# Patient Record
Sex: Male | Born: 1985 | Race: Black or African American | Hispanic: No | Marital: Single | Smoking: Never smoker
Health system: Southern US, Community
[De-identification: ages and names within clinical notes are randomized; demographics above are authoritative.]

## PROBLEM LIST (undated history)

## (undated) DIAGNOSIS — I10 Essential (primary) hypertension: Secondary | ICD-10-CM

---
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
## 2004-09-18 ENCOUNTER — Emergency Department (HOSPITAL_COMMUNITY): Admission: EM | Admit: 2004-09-18 | Discharge: 2004-09-18 | Payer: Self-pay | Admitting: Emergency Medicine

## 2006-03-05 ENCOUNTER — Emergency Department (HOSPITAL_COMMUNITY): Admission: EM | Admit: 2006-03-05 | Discharge: 2006-03-06 | Payer: Self-pay | Admitting: Emergency Medicine

## 2006-03-24 ENCOUNTER — Emergency Department (HOSPITAL_COMMUNITY): Admission: EM | Admit: 2006-03-24 | Discharge: 2006-03-24 | Payer: Self-pay | Admitting: Pediatrics

## 2006-04-17 ENCOUNTER — Emergency Department (HOSPITAL_COMMUNITY): Admission: EM | Admit: 2006-04-17 | Discharge: 2006-04-17 | Payer: Self-pay | Admitting: Emergency Medicine

## 2006-08-06 ENCOUNTER — Emergency Department (HOSPITAL_COMMUNITY): Admission: EM | Admit: 2006-08-06 | Discharge: 2006-08-06 | Payer: Self-pay | Admitting: Family Medicine

## 2007-05-06 ENCOUNTER — Emergency Department (HOSPITAL_COMMUNITY): Admission: EM | Admit: 2007-05-06 | Discharge: 2007-05-06 | Payer: Self-pay | Admitting: Emergency Medicine

## 2008-06-15 ENCOUNTER — Emergency Department (HOSPITAL_COMMUNITY): Admission: EM | Admit: 2008-06-15 | Discharge: 2008-06-15 | Payer: Self-pay | Admitting: Emergency Medicine

## 2009-06-15 ENCOUNTER — Emergency Department (HOSPITAL_COMMUNITY): Admission: EM | Admit: 2009-06-15 | Discharge: 2009-06-15 | Payer: Self-pay | Admitting: Emergency Medicine

## 2011-12-19 ENCOUNTER — Encounter (HOSPITAL_COMMUNITY): Payer: Self-pay | Admitting: Emergency Medicine

## 2011-12-19 ENCOUNTER — Emergency Department (HOSPITAL_COMMUNITY): Payer: Self-pay

## 2011-12-19 ENCOUNTER — Emergency Department (HOSPITAL_COMMUNITY)
Admission: EM | Admit: 2011-12-19 | Discharge: 2011-12-19 | Disposition: A | Discharge status: Home / Self Care | Payer: Self-pay | Attending: Emergency Medicine | Admitting: Emergency Medicine

## 2011-12-19 DIAGNOSIS — I10 Essential (primary) hypertension: Secondary | ICD-10-CM | POA: Insufficient documentation

## 2011-12-19 DIAGNOSIS — R51 Headache: Secondary | ICD-10-CM | POA: Insufficient documentation

## 2011-12-19 MED ORDER — IBUPROFEN 600 MG PO TABS: 600.0000 mg | ORAL_TABLET | Freq: Four times a day (QID) | ORAL | Status: AC | PRN

## 2011-12-19 MED ORDER — CEPHALEXIN 500 MG PO CAPS: 500.0000 mg | ORAL_CAPSULE | Freq: Four times a day (QID) | ORAL | Status: AC

## 2011-12-19 NOTE — ED Notes (Signed)
Pt states he has a lump under the skin on the right side of his face next to his nose  Pt states it is not painful but feels hot every now and then

## 2011-12-19 NOTE — Discharge Instructions (Signed)
Ibuprofen for pain. Take keflex as prescribed until all gone for possible infection. Follow up with a primary care doctor for further evaluation. You can also follow up with ENT.   Facial Infection You have an infection of your face. This requires special attention to help prevent serious problems. Infections in facial wounds can cause poor healing and scars. They can also spread to deeper tissues, especially around the eye. Wound and dental infections can lead to sinusitis, infection of the eye socket, and even meningitis. Permanent damage to the skin, eye, and nervous system may result if facial infections are not treated properly. With severe infections, hospital care for IV antibiotic injections may be needed if they don't respond to oral antibiotics. Antibiotics must be taken for the full course to insure the infection is eliminated. If the infection came from a bad tooth, it may have to be extracted when the infection is under control. Warm compresses may be applied to reduce skin irritation and remove drainage. You might need a tetanus shot now if:  You cannot remember when your last tetanus shot was.   You have never had a tetanus shot.   The object that caused your wound was dirty.  If you need a tetanus shot, and you decide not to get one, there is a rare chance of getting tetanus. Sickness from tetanus can be serious. If you got a tetanus shot, your arm may swell, get red and warm to the touch at the shot site. This is common and not a problem. SEEK IMMEDIATE MEDICAL CARE IF:   You have increased swelling, redness, or trouble breathing.   You have a severe headache, dizziness, nausea, or vomiting.   You develop problems with your eyesight.   You have a fever.  Document Released: 07/19/2004 Document Revised: 05/31/2011 Document Reviewed: 06/11/2005 [facility] Patient Information 2012 [place], [place].

## 2011-12-19 NOTE — ED Provider Notes (Signed)
History     CSN: [id number]  Arrival date & time 12/19/11  0620   First MD Initiated Contact with Patient 12/19/11 [phone]      Chief Complaint  Patient presents with  . Facial Swelling    (Consider location/radiation/quality/duration/timing/severity/associated sxs/prior treatment) HPI Comments: Pt is a 26yo male who presents with cc of facial pain and swelling. States "I have a blood clot in my face."  States pain started few days ago, it is in his right maxilla. Are tender to palpation. No injury. No visual changes, no ear ache, no toothache, no sore throat. Denies fever, chills. States hx of the same few years ago, states was told he had a blood clot and was put on some medication.       History reviewed. No pertinent past medical history.  History reviewed. No pertinent past surgical history.  Family History  Problem Relation Age of Onset  . Hypertension Other     History  Substance Use Topics  . Smoking status: Never Smoker   . Smokeless tobacco: Not on file  . Alcohol Use: No      Review of Systems  Constitutional: Negative for fever and chills.  HENT: Positive for facial swelling. Negative for ear pain, congestion, sore throat, mouth sores, neck pain, neck stiffness and dental problem.   Respiratory: Negative.   Cardiovascular: Negative.   Musculoskeletal: Negative.   Skin: Positive for color change.    Allergies  Review of patient's allergies indicates no known allergies.  Home Medications  No current outpatient prescriptions on file.  BP 132/86  Pulse 58  Temp 98.3 F (36.8 C) (Oral)  Resp 18  SpO2 100%  Physical Exam  Nursing note and vitals reviewed. Constitutional: He is oriented to person, place, and time. He appears well-developed and well-nourished. No distress.  HENT:  Head: Normocephalic.  Right Ear: External ear normal.  Left Ear: External ear normal.  Nose: Nose normal.  Mouth/Throat: Oropharynx is clear and moist.       Tender to  palpation over right maxilla. I do not see any erythema, no induration, no swelling. Dentition is normal. Ear canal and TMs normal  Eyes: Conjunctivae are normal.  Neck: Neck supple.  Cardiovascular: Normal rate, regular rhythm and normal heart sounds.   Pulmonary/Chest: Effort normal and breath sounds normal.  Neurological: He is alert and oriented to person, place, and time.  Skin: Skin is warm and dry.  Psychiatric: He has a normal mood and affect.    ED Course  Procedures (including critical care time)  No results found for this or any previous visit. Dg Orthopantogram  12/19/2011  *RADIOLOGY REPORT*  Clinical Data: Facial pain.  Evaluate for abscess. Swelling of the right maxilla.  ORTHOPANTOGRAM/PANORAMIC  Comparison: No priors.  Findings: Teeth number 19 and 31 are absent.  No definite periapical lucencies are identified to suggest abscesses.  The mandible is intact.  Visualized portions of the paranasal sinus are unremarkable.  IMPRESSION: 1.  No definite periapical lucencies to suggest periapical abscesses.  Original Report Authenticated By: Florencia Reasons, M.D.    Pt with right facial pain for few days. States this has been happening for several years on and off. I do not see any swelling or palpate any masses/cyst/abscess over this area. Pt was seen x3 for the same few years ago, and was treated with antibiotics. Pt wants referral to a specialist. He is non toxic, afebrile. Will start on antibiotic, follow up with PCP and /or  ent.   1. Facial pain       MDM          Lottie Mussel, PA 12/19/11 [phone]

## 2011-12-19 NOTE — ED Provider Notes (Signed)
Medical screening examination/treatment/procedure(s) were performed by non-physician practitioner and as supervising physician I was immediately available for consultation/collaboration.  Olivia Mackie, MD 12/19/11 [phone]

## 2012-12-13 ENCOUNTER — Encounter (HOSPITAL_COMMUNITY): Payer: Self-pay | Admitting: *Deleted

## 2012-12-13 ENCOUNTER — Emergency Department (HOSPITAL_COMMUNITY)
Admission: EM | Admit: 2012-12-13 | Discharge: 2012-12-13 | Disposition: A | Discharge status: Home / Self Care | Payer: Worker's Compensation | Attending: Emergency Medicine | Admitting: Emergency Medicine

## 2012-12-13 DIAGNOSIS — S61409A Unspecified open wound of unspecified hand, initial encounter: Secondary | ICD-10-CM | POA: Insufficient documentation

## 2012-12-13 DIAGNOSIS — Y9289 Other specified places as the place of occurrence of the external cause: Secondary | ICD-10-CM | POA: Insufficient documentation

## 2012-12-13 DIAGNOSIS — Z23 Encounter for immunization: Secondary | ICD-10-CM | POA: Insufficient documentation

## 2012-12-13 DIAGNOSIS — W460XXA Contact with hypodermic needle, initial encounter: Secondary | ICD-10-CM | POA: Insufficient documentation

## 2012-12-13 DIAGNOSIS — Y939 Activity, unspecified: Secondary | ICD-10-CM | POA: Insufficient documentation

## 2012-12-13 MED ORDER — HEPATITIS B IMMUNE GLOBULIN IM SOLN
0.0600 mL/kg | Freq: Once | INTRAMUSCULAR | Status: DC
  Filled 2012-12-13: qty 10

## 2012-12-13 MED ORDER — HEPATITIS B VAC RECOMBINANT 5 MCG/0.5ML IJ SUSP
0.5000 mL | Freq: Once | INTRAMUSCULAR | Status: AC
  Administered 2012-12-13: 5 ug via INTRAMUSCULAR
  Filled 2012-12-13: qty 0.5

## 2012-12-13 MED ORDER — TETANUS-DIPHTH-ACELL PERTUSSIS 5-2.5-18.5 LF-MCG/0.5 IM SUSP
0.5000 mL | Freq: Once | INTRAMUSCULAR | Status: AC
  Administered 2012-12-13: 0.5 mL via INTRAMUSCULAR
  Filled 2012-12-13: qty 0.5

## 2012-12-13 MED ORDER — HEPATITIS B IMMUNE GLOBULIN IM SOLN
5.0000 mL | Freq: Once | INTRAMUSCULAR | Status: AC
  Administered 2012-12-13: 5 mL via INTRAMUSCULAR
  Filled 2012-12-13: qty 5

## 2012-12-13 NOTE — ED Provider Notes (Signed)
History     CSN: [id number]  Arrival date & time 12/13/12  0206   First MD Initiated Contact with Patient 12/13/12 0242      Chief Complaint  Patient presents with  . Puncture Wound    (Consider location/radiation/quality/duration/timing/severity/associated sxs/prior treatment) HPI 27 year old male presents to emergency room after a needlestick.  Patient works a Biomedical scientist.  He was stuck with a insulin syringe in his right palm.  Patient reports it bled heavily after he was stuck.  He claims that thoroughly with soap and water, and alcohol.  No further bleeding.  He does not know where the needles came from, with no way of tracing the source.  Patient denies any risk factors for HIV.  He has never had hepatitis B vaccinations.  He does not know his last tetanus shot.  History reviewed. No pertinent past medical history.  History reviewed. No pertinent past surgical history.  Family History  Problem Relation Age of Onset  . Hypertension Other     History  Substance Use Topics  . Smoking status: Never Smoker   . Smokeless tobacco: Not on file  . Alcohol Use: No      Review of Systems  All other systems reviewed and are negative.    Allergies  Review of patient's allergies indicates no known allergies.  Home Medications  No current outpatient prescriptions on file.  BP 132/91  Pulse 63  Temp(Src) 97.8 F (36.6 C) (Oral)  Resp 18  Ht 5\' 9"  (1.753 m)  Wt 195 lb (88.451 kg)  BMI 28.78 kg/m2  SpO2 100%  Physical Exam  Nursing note and vitals reviewed. Constitutional: He appears well-developed and well-nourished. No distress.  Musculoskeletal: Normal range of motion. He exhibits no edema and no tenderness.  Unable to find the punctured area on his palm.  No swelling, no drainage, no erythema  Skin: He is not diaphoretic.    ED Course  Procedures (including critical care time)  Labs Reviewed  RAPID HIV SCREEN ([facility])  HEPATITIS PANEL, ACUTE    No results found.   1. Needlestick injury accident, initial encounter       MDM  27 yo male with needlestick.  D/w Dr Ninetta Lights on call for ID.  He does not recommend PEP therapy for HIV.  Will give Hep B IG and 1st vaccine, get baseline HIV and hepatitis panel.        Olivia Mackie, MD 12/13/12 [phone]

## 2012-12-13 NOTE — ED Notes (Signed)
The patient is AOx4 and comfortable with the discharge instructions. 

## 2012-12-13 NOTE — ED Notes (Signed)
The pt works at the [employer] and he was getting bags off the belt when a bag of insulin needles came off and one stuck the palm of his rt hand

## 2012-12-14 LAB — HEPATITIS PANEL, ACUTE
Hep A IgM: NEGATIVE
Hep B C IgM: NEGATIVE

## 2013-07-25 ENCOUNTER — Emergency Department (HOSPITAL_COMMUNITY)
Admission: EM | Admit: 2013-07-25 | Discharge: 2013-07-25 | Disposition: A | Discharge status: Home / Self Care | Payer: Self-pay | Attending: Emergency Medicine | Admitting: Emergency Medicine

## 2013-07-25 ENCOUNTER — Encounter (HOSPITAL_COMMUNITY): Payer: Self-pay | Admitting: Emergency Medicine

## 2013-07-25 DIAGNOSIS — L02519 Cutaneous abscess of unspecified hand: Secondary | ICD-10-CM | POA: Insufficient documentation

## 2013-07-25 DIAGNOSIS — Y9389 Activity, other specified: Secondary | ICD-10-CM | POA: Insufficient documentation

## 2013-07-25 DIAGNOSIS — L03019 Cellulitis of unspecified finger: Principal | ICD-10-CM | POA: Insufficient documentation

## 2013-07-25 DIAGNOSIS — W268XXA Contact with other sharp object(s), not elsewhere classified, initial encounter: Secondary | ICD-10-CM | POA: Insufficient documentation

## 2013-07-25 DIAGNOSIS — Y9289 Other specified places as the place of occurrence of the external cause: Secondary | ICD-10-CM | POA: Insufficient documentation

## 2013-07-25 DIAGNOSIS — L02511 Cutaneous abscess of right hand: Secondary | ICD-10-CM

## 2013-07-25 MED ORDER — CEPHALEXIN 500 MG PO CAPS: 500.0000 mg | ORAL_CAPSULE | Freq: Two times a day (BID) | ORAL | Status: DC

## 2013-07-25 NOTE — ED Provider Notes (Signed)
CSN: [id number]     Arrival date & time 07/25/13  1326 History  This chart was scribed for Coral Ceo, PA working with Suzi Roots, MD, by Lindajo Royal ED Scribe. This patient was seen in room TR05C/TR05C and the patient's care was started at 2:35 PM.    Chief Complaint  Patient presents with  . Hand Pain    The history is provided by the patient. No language interpreter was used.   HPI Comments: Austin Alvarez is a 28 y.o. male with no PMH who presents to the Emergency Department complaining of a blister on his right thumb that began 2 days ago.  Patient states that he cut himself at work opening boxes. He denies any foreign bodies/trauma/burns. He states he developed a blister around the area which grew larger.  Pt reports draining the blister prior to reporting to the ED.  He reports clear discharge with no purulent drainage. Pt describes the pain as burning. Pain is worse with movement.  Patient did not take anything for pain PTA.  Pt denies associated symptoms including fever, weakness, loss of sensation, numbness/tingling. Pt denies any past medical conditions. Pt reports tetanus shots are UTD.   History reviewed. No pertinent past medical history. History reviewed. No pertinent past surgical history. Family History  Problem Relation Age of Onset  . Hypertension Other    History  Substance Use Topics  . Smoking status: Never Smoker   . Smokeless tobacco: Not on file  . Alcohol Use: No    Review of Systems  Constitutional: Negative for fever.  Skin: Positive for wound (Blister on right mp joint).  All other systems reviewed and are negative.   Allergies  Review of patient's allergies indicates no known allergies.  Home Medications   Current Outpatient Rx  Name  Route  Sig  Dispense  Refill  . cephALEXin (KEFLEX) 500 MG capsule   Oral   Take 1 capsule (500 mg total) by mouth 2 (two) times daily.   20 capsule   0     Triage Vitals: BP 138/93  Pulse 79   Temp(Src) 98.2 F (36.8 C) (Oral)  Resp 16  Ht 5\' 8"  (1.727 m)  Wt 171 lb 3.2 oz (77.656 kg)  BMI 26.04 kg/m2  SpO2 97%  Filed Vitals:   07/25/13 1331  BP: 138/93  Pulse: 79  Temp: 98.2 F (36.8 C)  TempSrc: Oral  Resp: 16  Height: 5\' 8"  (1.727 m)  Weight: 171 lb 3.2 oz (77.656 kg)  SpO2: 97%    Physical Exam  Nursing note and vitals reviewed. Constitutional: He is oriented to person, place, and time. He appears well-developed and well-nourished. No distress.  HENT:  Head: Normocephalic and atraumatic.  Right Ear: External ear normal.  Left Ear: External ear normal.  Mouth/Throat: Oropharynx is clear and moist.  Eyes: Conjunctivae are normal. Right eye exhibits no discharge. Left eye exhibits no discharge.  Neck: Normal range of motion. Neck supple.  Cardiovascular: Normal rate, regular rhythm and normal heart sounds.  Exam reveals no gallop and no friction rub.   No murmur heard. Radial pulses present and equal bilaterally. Capillary refill <2 seconds on digits of right hand.   Pulmonary/Chest: Effort normal and breath sounds normal. No respiratory distress. He has no wheezes. He has no rales. He exhibits no tenderness.  Abdominal: Soft. He exhibits no distension. There is no tenderness.  Musculoskeletal: Normal range of motion. He exhibits edema and tenderness.  Hands: Patient able to flex and extend digits of right hand without difficulty or limitations.    Neurological: He is alert and oriented to person, place, and time.  Sensation intact in the right hand  Skin: Skin is warm and dry. He is not diaphoretic.  1.5 cm x 1.5 cm fluctuant mass to the right thenar eminence and MP joint of the right hand.  No surrounding edema or erythema.  No open wounds or lacerations.     ED Course  Procedures (including critical care time)  DIAGNOSTIC STUDIES: Oxygen Saturation is 97% on RA, normal by my interpretation.    COORDINATION OF CARE: 2:39 PM- Will drain the wound.  Pt advised of plan for treatment and pt agrees.  Labs Review Labs Reviewed - No data to display Imaging Review No results found.  EKG Interpretation   None      INCISION AND DRAINAGE Date/Time: 07/25/2013 2:30 PM Performed by: Coral CeoPALMER, Janat Tabbert K Authorized by: Coral CeoPALMER, Clearance Chenault K Consent: Verbal consent obtained. Consent given by: patient Patient identity confirmed: verbally with patient Type: abscess Body area: upper extremity Location details: right thumb Anesthesia: local infiltration Local anesthetic: lidocaine 2% without epinephrine Anesthetic total: 1 ml Patient sedated: no Scalpel size: 11 Incision type: single straight Complexity: simple Drainage: serous and purulent Drainage amount: moderate Wound treatment: wound left open Packing material: none Patient tolerance: Patient tolerated the procedure well with no immediate complications.   MDM   Austin Alvarez is a 28 y.o. male with no PMH who presents to the Emergency Department complaining of a blister on his right thumb that began 2 days ago.  Etiology of pain blister vs developing abscess.  I&D revealed serous and purulent material.  Patient neurovascularly intact. Patient placed on antibiotics (cannot afford Augmentin). Hand surgery consulted. Tetanus up to date. Return precautions, discharge instructions, and follow-up was discussed with the patient before discharge.    Consults  3:15 PM = Spoke with Dr. Izora Ribasoley who will follow-up in clinic.   Discharge Medication List as of 07/25/2013  3:17 PM    START taking these medications   Details  cephALEXin (KEFLEX) 500 MG capsule Take 1 capsule (500 mg total) by mouth 2 (two) times daily., Starting 07/25/2013, Until Discontinued, Print        Final impressions: 1. Abscess of thumb, right      Thomasenia SalesJessica Katlin Mishon Blubaugh PA-C    I personally performed the services described in this documentation, which was scribed in my presence. The recorded information has been  reviewed and is accurate.        Jillyn LedgerJessica K Mikayela Deats, PA-C 07/25/13 2154

## 2013-07-25 NOTE — ED Notes (Addendum)
Pt states he may have cut his R thumb at work Thursday on a box.  Pt states there was a blister, he popped it, clear fluid came out, and blister was smaller.  Pt woke today with significant swelling to R hand and large blister to R thumb.  Pt states his last Tetanus vaccine was about a year ago.

## 2013-07-25 NOTE — ED Notes (Addendum)
Pt was opening boxes on Thursday and thought he cut his R palm below thumb. Then yesterday woke with painful blister to the area. States "it feels like its on fire" but denies burning the area

## 2013-07-25 NOTE — Discharge Instructions (Signed)
Keep area clean and dry Apply antibiotic ointment  Do warm soaks a few times a day  Take antibiotics  Return to the emergency department if you develop any changing/worsening condition, spreading redness/swelling, increased drainage of pus, fever, severe pain or any other concerns (please read additional information regarding your condition below)   Abscess An abscess is an infected area that contains a collection of pus and debris.It can occur in almost any part of the body. An abscess is also known as a furuncle or boil. CAUSES  An abscess occurs when tissue gets infected. This can occur from blockage of oil or sweat glands, infection of hair follicles, or a minor injury to the skin. As the body tries to fight the infection, pus collects in the area and creates pressure under the skin. This pressure causes pain. People with weakened immune systems have difficulty fighting infections and get certain abscesses more often.  SYMPTOMS Usually an abscess develops on the skin and becomes a painful mass that is red, warm, and tender. If the abscess forms under the skin, you may feel a moveable soft area under the skin. Some abscesses break open (rupture) on their own, but most will continue to get worse without care. The infection can spread deeper into the body and eventually into the bloodstream, causing you to feel ill.  DIAGNOSIS  Your caregiver will take your medical history and perform a physical exam. A sample of fluid may also be taken from the abscess to determine what is causing your infection. TREATMENT  Your caregiver may prescribe antibiotic medicines to fight the infection. However, taking antibiotics alone usually does not cure an abscess. Your caregiver may need to make a small cut (incision) in the abscess to drain the pus. In some cases, gauze is packed into the abscess to reduce pain and to continue draining the area. HOME CARE INSTRUCTIONS   Only take over-the-counter or  prescription medicines for pain, discomfort, or fever as directed by your caregiver.  If you were prescribed antibiotics, take them as directed. Finish them even if you start to feel better.  If gauze is used, follow your caregiver's directions for changing the gauze.  To avoid spreading the infection:  Keep your draining abscess covered with a bandage.  Wash your hands well.  Do not share personal care items, towels, or whirlpools with others.  Avoid skin contact with others.  Keep your skin and clothes clean around the abscess.  Keep all follow-up appointments as directed by your caregiver. SEEK MEDICAL CARE IF:   You have increased pain, swelling, redness, fluid drainage, or bleeding.  You have muscle aches, chills, or a general ill feeling.  You have a fever. MAKE SURE YOU:   Understand these instructions.  Will watch your condition.  Will get help right away if you are not doing well or get worse. Document Released: 03/21/2005 Document Revised: 12/11/2011 Document Reviewed: 08/24/2011 [facility] Patient Information 2014 [place], [place].  Blisters Blisters are fluid-filled sacs that form within the skin. Common causes of blistering are friction, burns, and exposure to irritating chemicals. The fluid in the blister protects the underlying damaged skin. Most of the time it is not recommended that you open blisters. When a blister is opened, there is an increased chance for infection. Usually, a blister will open on its own. They then dry up and peel off within 10 days. If the blister is tense and uncomfortable (painful) the fluid may be drained. If it is drained the roof  of the blister should be left intact. The draining should only be done by a medical professional under aseptic conditions. Poorly fitting shoes and boots can cause blisters by being too tight or too loose. Wearing extra socks or using tape, bandages, or pads over the blister-prone area helps prevent the problem  by reducing friction. Blisters heal more slowly if you have diabetes or if you have problems with your circulation. You need to be careful about medical follow-up to prevent infection. HOME CARE INSTRUCTIONS  Protect areas where blisters have formed until the skin is healed. Use a special bandage with a hole cut in the middle around the blister. This reduces pressure and friction. When the blister breaks, trim off the loose skin and keep the area clean by washing it with soap daily. Soaking the blister or broken-open blister with diluted vinegar twice daily for 15 minutes will dry it up and speed the healing. Use 3 tablespoons of white vinegar per quart of water (45 mL white vinegar per liter of water). An antibiotic ointment and a bandage can be used to cover the area after soaking.  SEEK MEDICAL CARE IF:   You develop increased redness, pain, swelling, or drainage in the blistered area.  You develop a pus-like discharge from the blistered area, chills, or a fever. MAKE SURE YOU:   Understand these instructions.  Will watch your condition.  Will get help right away if you are not doing well or get worse. Document Released: 07/19/2004 Document Revised: 09/03/2011 Document Reviewed: 06/16/2008 [facility] Patient Information 2014 [place], [place].   Emergency Department Resource Guide 1) Find a Doctor and Pay Out of Pocket Although you won't have to find out who is covered by your insurance plan, it is a good idea to ask around and get recommendations. You will then need to call the office and see if the doctor you have chosen will accept you as a new patient and what types of options they offer for patients who are self-pay. Some doctors offer discounts or will set up payment plans for their patients who do not have insurance, but you will need to ask so you aren't surprised when you get to your appointment.  2) Contact Your Local Health Department Not all health departments have doctors that can  see patients for sick visits, but many do, so it is worth a call to see if yours does. If you don't know where your local health department is, you can check in your phone book. The CDC also has a tool to help you locate your state's health department, and many state websites also have listings of all of their local health departments.  3) Find a Walk-in Clinic If your illness is not likely to be very severe or complicated, you may want to try a walk in clinic. These are popping up all over the country in pharmacies, drugstores, and shopping centers. They're usually staffed by nurse practitioners or physician assistants that have been trained to treat common illnesses and complaints. They're usually fairly quick and inexpensive. However, if you have serious medical issues or chronic medical problems, these are probably not your best option.  No Primary Care Doctor: - Call Health Connect at  [phone] - they can help you locate a primary care doctor that  accepts your insurance, provides certain services, etc. - Physician Referral Service- [phone]  Chronic Pain Problems: Organization         Address  Phone   Notes  [facility]  [phone] Patients need to be referred by their primary care doctor.   Medication Assistance: Organization         Address  Phone   Notes  [facility] [address] [place], [place] [address] [phone]) [phone] --Must be a resident of [facility] [place] -- Must have NO insurance coverage whatsoever (no Medicaid/ Medicare, etc.) -- The pt. MUST have a primary care doctor that directs their care regularly and follows them in the community   Med[facility]ist  [phone]   [employer]  [phone]    Agencies that provide inexpensive medical care: Organization         Address  Phone   Notes  [facility]  [phone]   [facility]    [phone]   [facility] [address] [place], [place] [address] [phone]) [phone]   [facility] [place] [address], [place] [phone]) [phone]   [facility]anned [facility]    [phone]   [facility]    [phone]   [facility]  [address], [place] Phone:  [phone]) [phone], Fax:  [phone]) [phone] Hours of Operation:  9 am - 6 pm, M-F.  Also accepts Medicaid/Medicare and self-pay.  [facility]  [address], [place] Phone: [phone]) [phone], Fax: [phone]. Hours of Operation:  8:30 am - 5:30 pm, M-F.  Also accepts Medicaid and self-pay.  [facility] [place] [address], [place] Phone: [phone](336) [phone]   [facility] [address], [place], [place] [phone], Ext. [phone] Mondays & Thursdays: 7-9 AM.  First 15 patients are seen on a first come, first serve basis.    Medicaid-accepting [facility] Providers:  Organization         Address  Phone   Notes  [facility] [address], [place] [phone] Also accepts self-pay patients.  [facility] [address], [place]  [phone]) [phone]   [facility] [address], [place] [phone]) [phone]   [facility] [address] [place] [address], [place] [phone]) [phone]   Renaye RakersVeita Bland [address], [place]   [phone] Only accepts [place] Access [place] patients after they have their name applied to their card.   Self-Pay (no insurance) in [facility]:  Organization         Address  Phone   Notes  Sickle Cell Patients, [facility] [address], [place] [phone]   [facility] [address], [place] [phone]) [phone]   [facility] [place]  [address], [place] [phone]) [phone]   [facility]/Dr. Osei-Bonsu   [address] [place] [address], [place] or [phone], [place] [phone] Phone number for both [place] and [place] locations is the same.  [facility] [address], [place] [phone]) [phone]   [facility]  [address] [place] [address], [place] or [address] [phone]   [facility] [address], [place] [phone]) [phone], phone; [phone], fax Sees patients 1st and 3rd Saturday of every month.  Must not qualify for public or private insurance (i.e. Medicaid, Medicare, [place] Health  Choice, Veterans' Benefits)  Household income should be no more than 200% of the poverty level The clinic cannot treat you if you are pregnant or think you are pregnant  Sexually transmitted diseases are not treated at the clinic.    Dental Care: Organization         Address  Phone  Notes  [facility] of [facility] [address] [place], [place] [phone] Accepts children up to age 58 who are enrolled in [place] or [place] Health Choice; pregnant women with a Medicaid card; and children who have applied for Medicaid or [place] Health Choice, but were declined, whose parents can pay a reduced fee at time of service.  [facility] of [facility]  [address], [place] [phone] Accepts children up to age 51 who are enrolled in [place] or [place] Health Choice; pregnant women with a Medicaid card; and children who have applied for Medicaid or [place] Health Choice, but were declined, whose parents can pay a reduced fee at time of service.  [facility]  [address] [place], [place] [phone] Patients are seen by appointment only. Walk-ins are not accepted. [facility] will see patients 55 years of age and older. Monday - Tuesday (8am-5pm) Most Wednesdays (8:30-5pm) $30 per visit, cash only  [facility] Access  PROGRAM  [address], [facility] [phone] Patients are seen by appointment only. Walk-ins are not accepted. [facility] will see patients 38 years of age and older. One Wednesday Evening (Monthly: Volunteer Based).  $30 per visit, cash only  [employer]  [phone] for adults; Children under age 61, call Graduate Pediatric Dentistry at [phone]. Children aged 42-14, please call [phone] to request a pediatric application.  Dental services are provided in all areas of dental care including fillings, crowns and bridges, complete and partial dentures, implants, gum treatment, root canals, and extractions. Preventive care is also provided. Treatment is provided to both adults and children. Patients are selected via a lottery and there is often a waiting list.   [facility] [address] [place]  [phone] [email or web address]   [facility] [address] [place], [place] [phone], Ext. [phone] Second and Fourth Thursday of each month, opens at 6:30 AM; Clinic ends at 9 AM.  Patients are seen on a first-come first-served basis, and a limited number are seen during each clinic.   [facility]  [address] [place], [place] [phone]   Eligibility Requirements You must have lived in [place], [place], or [place] counties for at least the last three months.   You cannot be eligible for state or federal sponsored [place], including [employer], [place], or [employer].   You generally cannot be eligible for healthcare insurance through your employer.    How to apply: Eligibility screenings are held every Tuesday and Wednesday afternoon from 1:00 pm until 4:00 pm. You do not need an appointment for the interview!  [facility] [address], [place], [place] [phone]   [facility] Health Department  [phone]   [facility] Health Department   [phone]   [facility] Health Department  [phone]    Behavioral Health Resources in the Community: Intensive Outpatient Programs Organization         Address  Phone  Notes  [facility] [address], [place], [place] [phone]   [facility] Outpatient [address] [place], [place] [phone]   [facility]: Alcohol [facility] Drug Svcs [address], [place], [place]  [phone]   [facility] Mental Health [address],  [place], [place] [phone] or [phone]   Substance Abuse Resources Organization         Address  Phone  Notes  Alcohol [facility] Drug Services  [phone]   [facility]  [phone]   [facility]  [phone]   Floydene Flock  [phone]   Residential & Outpatient Substance Abuse Program  [phone]   Psychological Services Organization         Address  Phone  Notes  [facility]  [phone]   [facility]  [phone]   [facility] [address] [place] [phone] or [phone]    Mobile Crisis Teams Organization         Address  Phone  Notes  [facility], Mobile Crisis Care Unit  [phone]   [facility]  [address] [place], [place] [phone]   Doristine Locks [address] [place] [phone]    Self-Help/Support Groups Organization         Address  Phone             Notes  [facility] of [place] - variety of support groups  [phone]- I7437963 Call for more information  Narcotics Anonymous (NA), Caring [facility] [address], [employer] [place]  2 meetings at this location   Statistician         Address  Phone  Notes  [facility] [address],    [place]  [phone]   [facility]  [address], [place] [address], [place], [place] [phone]   [facility] [address]  [place], [place] [phone] Admissions: 8am-3pm M-F  [facility] [address]    [place], [place] [phone]   [facility] [address] [place], [place], [place] [phone]   [facility] [address],  [place], [place] [phone]   [facility] - Intensive Outpatient [address], [place], [place] [phone]   [facility] (Addiction Recovery Care [facility]oc[facility] [address] [place]  [place], [place] [phone] or [phone]   [facility] (RTS) [address], [place], [place] [phone] Accepts Medicaid  [facility] [address],  [place] [phone] Substance Abuse/Addiction Treatment   [facility] Organization         Address  Phone  Notes  [facility]  4425257800   Angie Fava, PhD [address] [place], [place]   [phone] or [phone]   [facility] Behavioral   [address] [place], [place] [phone]   [facility] [address], [place], [place] [phone] Insurance/Medicaid/sponsorship through [facility] and Families [address]                                    [place], [place] [phone] Therapy/tele-psych/case  [facility] [address], [place] [phone]    Dr. Lolly Mustache  [phone]   [facility]  [employer] [facility] Dept. 1) [address]  2) [place] 3)  [place], [place] [phone]  [phone]   [facility] Child Abuse Hotline 210-597-2012 or [phone] (After Hours)

## 2013-07-26 NOTE — ED Provider Notes (Signed)
Medical screening examination/treatment/procedure(s) were performed by non-physician practitioner and as supervising physician I was immediately available for consultation/collaboration.  EKG Interpretation   None         Zakkiyya Barno E Jarome Trull, MD 07/26/13 1347 

## 2013-08-01 ENCOUNTER — Emergency Department (HOSPITAL_COMMUNITY)
Admission: EM | Admit: 2013-08-01 | Discharge: 2013-08-01 | Disposition: A | Discharge status: Home / Self Care | Payer: Self-pay | Attending: Emergency Medicine | Admitting: Emergency Medicine

## 2013-08-01 ENCOUNTER — Encounter (HOSPITAL_COMMUNITY): Payer: Self-pay | Admitting: Emergency Medicine

## 2013-08-01 DIAGNOSIS — B86 Scabies: Secondary | ICD-10-CM | POA: Insufficient documentation

## 2013-08-01 DIAGNOSIS — Z792 Long term (current) use of antibiotics: Secondary | ICD-10-CM | POA: Insufficient documentation

## 2013-08-01 MED ORDER — HYDROXYZINE HCL 25 MG PO TABS: 25.0000 mg | ORAL_TABLET | Freq: Four times a day (QID) | ORAL | Status: AC

## 2013-08-01 MED ORDER — PERMETHRIN 5 % EX CREA: 1.0000 "application " | TOPICAL_CREAM | Freq: Once | CUTANEOUS | Status: AC

## 2013-08-01 NOTE — ED Notes (Signed)
Pt states that he and his brother got an apt together and his brother had "his girl" stay with him with her kids. Before the kids left, they were breaking out in an itchy rash.  Pt c/o rash to arms and legs x 1 wk.

## 2013-08-01 NOTE — Discharge Instructions (Signed)
Scabies  Scabies are small bugs (mites) that burrow under the skin and cause red bumps and severe itching. These bugs can only be seen with a microscope. Scabies are highly contagious. They can spread easily from person to person by direct contact. They are also spread through sharing clothing or linens that have the scabies mites living in them. It is not unusual for an entire family to become infected through shared towels, clothing, or bedding.   HOME CARE INSTRUCTIONS   · Your caregiver may prescribe a cream or lotion to kill the mites. If cream is prescribed, massage the cream into the entire body from the neck to the bottom of both feet. Also massage the cream into the scalp and face if your child is less than 1 year old. Avoid the eyes and mouth. Do not wash your hands after application.  · Leave the cream on for 8 to 12 hours. Your child should bathe or shower after the 8 to 12 hour application period. Sometimes it is helpful to apply the cream to your child right before bedtime.  · One treatment is usually effective and will eliminate approximately 95% of infestations. For severe cases, your caregiver may decide to repeat the treatment in 1 week. Everyone in your household should be treated with one application of the cream.  · New rashes or burrows should not appear within 24 to 48 hours after successful treatment. However, the itching and rash may last for 2 to 4 weeks after successful treatment. Your caregiver may prescribe a medicine to help with the itching or to help the rash go away more quickly.  · Scabies can live on clothing or linens for up to 3 days. All of your child's recently used clothing, towels, stuffed toys, and bed linens should be washed in hot water and then dried in a dryer for at least 20 minutes on high heat. Items that cannot be washed should be enclosed in a plastic bag for at least 3 days.  · To help relieve itching, bathe your child in a cool bath or apply cool washcloths to the  affected areas.  · Your child may return to school after treatment with the prescribed cream.  SEEK MEDICAL CARE IF:   · The itching persists longer than 4 weeks after treatment.  · The rash spreads or becomes infected. Signs of infection include red blisters or yellow-tan crust.  Document Released: 06/11/2005 Document Revised: 09/03/2011 Document Reviewed: 10/20/2008  ExitCare® Patient Information ©2014 [employer].

## 2013-08-01 NOTE — ED Provider Notes (Signed)
CSN: [id number]     Arrival date & time 08/01/13  1217 History  This chart was scribed for non-physician practitioner Arthor Captain working with Hurman Horn, MD by Carl Best, ED Scribe. This patient was seen in room WTR7/WTR7 and the patient's care was started at 12:40 PM.    Chief Complaint  Patient presents with  . Rash    Patient is a 28 y.o. male presenting with rash. The history is provided by the patient. No language interpreter was used.  Rash  HPI Comments: Austin Alvarez is a 28 y.o. male who presents to the Emergency Department complaining of a constant itchy, scaly, dry rash that started a week ago.  The patient states that the rash started on his right hip and has since spread to his left hip and bilateral legs.  He states that the rash is not erythematous or warm.  The patient states that he has applied lotion and cortisone to the rash with no relief in his symptoms.  He states that his brother's girlfriend's kids were staying with him and had similar symptoms.    No past medical history on file. No past surgical history on file. Family History  Problem Relation Age of Onset  . Hypertension Other    History  Substance Use Topics  . Smoking status: Never Smoker   . Smokeless tobacco: Not on file  . Alcohol Use: No    Review of Systems  Skin: Positive for rash.  All other systems reviewed and are negative.    Allergies  Review of patient's allergies indicates no known allergies.  Home Medications   Current Outpatient Rx  Name  Route  Sig  Dispense  Refill  . cephALEXin (KEFLEX) 500 MG capsule   Oral   Take 1 capsule (500 mg total) by mouth 2 (two) times daily.   20 capsule   0    Triage Vitals: BP 146/93  Pulse 73  Temp(Src) 97.5 F (36.4 C) (Oral)  Resp 16  SpO2 100%  Physical Exam  Nursing note and vitals reviewed. Constitutional: He is oriented to person, place, and time. He appears well-developed and well-nourished.  HENT:  Head:  Normocephalic and atraumatic.  Eyes: EOM are normal.  Neck: Normal range of motion.  Cardiovascular: Normal rate.   Pulmonary/Chest: Effort normal.  Musculoskeletal: Normal range of motion.  Neurological: He is alert and oriented to person, place, and time.  Skin: Skin is warm and dry. Rash noted.  Bilateral flank and hip, anterior thighs, and posterior arms bilaterally crusted confluent lesions.  No drainage, erythema, weeping, or warmth.  Excoriation over abdomen and posterior arms bilaterally.    Psychiatric: He has a normal mood and affect. His behavior is normal.    ED Course  Procedures (including critical care time)  DIAGNOSTIC STUDIES: Oxygen Saturation is 100% on room air, normal by my interpretation.    COORDINATION OF CARE: 12:42 PM- Discussed a clinical suspicion of scabies and prescribing the patient with medication to treat his symptoms.  Advised the patient to clean his apartment again.  The patient agreed to the treatment plan.   Labs Review Labs Reviewed - No data to display Imaging Review No results found.  EKG Interpretation   None       MDM   1. Scabies    I feel the patient's symptoms are likely from crusted scabies infection.  Patient was taking Keflex for a finger infection he was started on July 25 2013 but I  do not feel that this is a drug reaction.  The patient does have multiple contacts with the same symptoms.  There is no sign of secondary infection.  The patient will be discharged with Elimite cream and Atarax.  Have discussed home infestation eradication.  Patient expresses understanding agrees with plan of care.  I personally performed the services described in this documentation, which was scribed in my presence. The recorded information has been reviewed and is accurate.     Arthor Captainbigail Jess Sulak, PA-C 08/01/13 1310

## 2013-08-01 NOTE — ED Provider Notes (Signed)
Medical screening examination/treatment/procedure(s) were performed by non-physician practitioner and as supervising physician I was immediately available for consultation/collaboration.  EKG Interpretation   None        Hurman HornJohn M Kimanh Templeman, MD 08/01/13 2009

## 2013-08-01 NOTE — ED Notes (Signed)
Rash present to trunk, bilateral upper extremities. Educated patient to all wash clothes and furniture coverings in hot water.

## 2014-10-07 ENCOUNTER — Emergency Department (HOSPITAL_COMMUNITY)
Admission: EM | Admit: 2014-10-07 | Discharge: 2014-10-07 | Disposition: A | Discharge status: Home / Self Care | Payer: Self-pay | Attending: Emergency Medicine | Admitting: Emergency Medicine

## 2014-10-07 ENCOUNTER — Encounter (HOSPITAL_COMMUNITY): Payer: Self-pay | Admitting: *Deleted

## 2014-10-07 DIAGNOSIS — Z792 Long term (current) use of antibiotics: Secondary | ICD-10-CM | POA: Insufficient documentation

## 2014-10-07 DIAGNOSIS — M436 Torticollis: Secondary | ICD-10-CM | POA: Insufficient documentation

## 2014-10-07 MED ORDER — HYDROCODONE-ACETAMINOPHEN 5-325 MG PO TABS
1.0000 | ORAL_TABLET | Freq: Once | ORAL | Status: AC
  Administered 2014-10-07: 1 via ORAL
  Filled 2014-10-07: qty 1

## 2014-10-07 MED ORDER — DIAZEPAM 5 MG PO TABS
5.0000 mg | ORAL_TABLET | Freq: Once | ORAL | Status: AC
  Administered 2014-10-07: 5 mg via ORAL
  Filled 2014-10-07: qty 1

## 2014-10-07 MED ORDER — NAPROXEN 500 MG PO TABS
500.0000 mg | ORAL_TABLET | Freq: Two times a day (BID) | ORAL | Status: DC
Start: 2014-10-07 — End: 2015-03-12

## 2014-10-07 MED ORDER — DIAZEPAM 5 MG PO TABS: 5.0000 mg | ORAL_TABLET | Freq: Three times a day (TID) | ORAL | Status: AC | PRN

## 2014-10-07 MED ORDER — KETOROLAC TROMETHAMINE 60 MG/2ML IM SOLN
30.0000 mg | Freq: Once | INTRAMUSCULAR | Status: AC
  Administered 2014-10-07: 30 mg via INTRAMUSCULAR
  Filled 2014-10-07: qty 2

## 2014-10-07 NOTE — ED Notes (Signed)
Pt c/o left sided neck pain for two days. Pt states pain increases when turning head to the right.

## 2014-10-07 NOTE — ED Provider Notes (Signed)
CSN: [id number]     Arrival date & time 10/07/14  1951 History  This chart was scribed for Jaynie Crumble, PA-C with Purvis Sheffield, MD by Tonye Royalty, ED Scribe. This patient was seen in room TR09C/TR09C and the patient's care was started at 8:30 PM.    Chief Complaint  Patient presents with  . Neck Pain   HPI  HPI Comments: Austin Alvarez is a 29 y.o. male who presents to the Emergency Department complaining of pain to left neck radiating to shoulder onset upon waking 2 days ago. He states it is worse when turning head to the right. He states he has tried Aleve and applying a hot rag without improvement.  He has not tried stretching. He denies numbness/tingling in his hand, fever, or sore throat. No injuries.  No hx of the same. No fever chills. No photophobia  History reviewed. No pertinent past medical history. History reviewed. No pertinent past surgical history. Family History  Problem Relation Age of Onset  . Hypertension Other    History  Substance Use Topics  . Smoking status: Never Smoker   . Smokeless tobacco: Not on file  . Alcohol Use: No    Review of Systems  Constitutional: Negative for fever.  HENT: Negative for sore throat.   Musculoskeletal: Positive for neck pain.  Neurological: Negative for numbness.      Allergies  Review of patient's allergies indicates no known allergies.  Home Medications   Prior to Admission medications   Medication Sig Start Date End Date Taking? Authorizing Provider  cephALEXin (KEFLEX) 500 MG capsule Take 1 capsule (500 mg total) by mouth 2 (two) times daily. 07/25/13   Jillyn Ledger, PA-C  hydrOXYzine (ATARAX/VISTARIL) 25 MG tablet Take 1 tablet (25 mg total) by mouth every 6 (six) hours. 08/01/13   Arthor Captain, PA-C  permethrin (ELIMITE) 5 % cream Apply 1 application topically once. 08/01/13   Abigail Harris, PA-C   BP 145/98 mmHg  Pulse 74  Temp(Src) 98.5 F (36.9 C) (Oral)  Resp 18  Ht  (1.727 m)  Wt 185  lb (83.915 kg)  BMI 28.14 kg/m2  SpO2 98% Physical Exam  Constitutional: He is oriented to person, place, and time. He appears well-developed and well-nourished.  HENT:  Head: Normocephalic and atraumatic.  Eyes: Conjunctivae are normal. Right eye exhibits no discharge. Left eye exhibits no discharge.  Neck: Neck supple.  No midline cervical spine tenderness. Tender to palpation over left trapezius from the base of this called away to the left shoulder. Age of motion of the neck, however full range of motion. Pain with active and passive range of motion of the left shoulder. Full range of motion of the shoulder. Distal radial pulses intact  Pulmonary/Chest: Effort normal. No respiratory distress.  Neurological: He is alert and oriented to person, place, and time. Coordination normal.  5/5 and equal upper strength bilaterally  Skin: Skin is warm and dry. No rash noted. He is not diaphoretic. No erythema.  Psychiatric: He has a normal mood and affect.  Nursing note and vitals reviewed.   ED Course  Procedures (including critical care time)  DIAGNOSTIC STUDIES: Oxygen Saturation is 98% on room air, normal by my interpretation.    COORDINATION OF CARE: 8:32 PM Discussed treatment plan with patient at beside, the patient agrees with the plan and has no further questions at this time.   Labs Review Labs Reviewed - No data to display  Imaging Review No results found.  EKG Interpretation None      MDM   Final diagnoses:  Torticollis    patient with acute onset of neck pain, just over the trapezius, no midline tenderness. Started when he woke up in the morning. He is neurovascularly intact. No injuries. No fever or chills. Afebrile here in emergency department. Symptoms exacerbated with palpation of trapezius and moving his head and left shoulder. Most likely left trapezius spasms. Will treat with Valium and naproxen at home.  Filed Vitals:   10/07/14 2005  BP: 145/98  Pulse:  74  Temp: 98.5 F (36.9 C)  TempSrc: Oral  Resp: 18  Height: 5\' 8"  (1.727 m)  Weight: 185 lb (83.915 kg)  SpO2: 98%     I personally performed the services described in this documentation, which was scribed in my presence. The recorded information has been reviewed and is accurate.   Jaynie Crumbleatyana Ankit Degregorio, PA-C 10/07/14 2222  Purvis SheffieldForrest Harrison, MD 10/08/14 [id number]

## 2014-10-07 NOTE — Discharge Instructions (Signed)
Take naproxen and Valium as prescribed. Valium is for spasms, do not drive if taking. Try heating pads, stretching. Follow-up with your doctor if not improving. Return if any numbness or weakness in your hand, visual changes, and fever, new concerning symptoms.  Torticollis, Acute You have suddenly (acutely) developed a twisted neck (torticollis). This is usually a self-limited condition. CAUSES  Acute torticollis may be caused by malposition, trauma or infection. Most commonly, acute torticollis is caused by sleeping in an awkward position. Torticollis may also be caused by the flexion, extension or twisting of the neck muscles beyond their normal position. Sometimes, the exact cause may not be known. SYMPTOMS  Usually, there is pain and limited movement of the neck. Your neck may twist to one side. DIAGNOSIS  The diagnosis is often made by physical examination. X-rays, CT scans or MRIs may be done if there is a history of trauma or concern of infection. TREATMENT  For a common, stiff neck that develops during sleep, treatment is focused on relaxing the contracted neck muscle. Medications (including shots) may be used to treat the problem. Most cases resolve in several days. Torticollis usually responds to conservative physical therapy. If left untreated, the shortened and spastic neck muscle can cause deformities in the face and neck. Rarely, surgery is required. HOME CARE INSTRUCTIONS   Use over-the-counter and prescription medications as directed by your caregiver.  Do stretching exercises and massage the neck as directed by your caregiver.  Follow up with physical therapy if needed and as directed by your caregiver. SEEK IMMEDIATE MEDICAL CARE IF:   You develop difficulty breathing or noisy breathing (stridor).  You drool, develop trouble swallowing or have pain with swallowing.  You develop numbness or weakness in the hands or feet.  You have changes in speech or vision.  You have  problems with urination or bowel movements.  You have difficulty walking.  You have a fever.  You have increased pain. MAKE SURE YOU:   Understand these instructions.  Will watch your condition.  Will get help right away if you are not doing well or get worse. Document Released: 06/08/2000 Document Revised: 09/03/2011 Document Reviewed: 07/20/2009 [facility] Patient Information 2015 [place], [place]. This information is not intended to replace advice given to you by your health care provider. Make sure you discuss any questions you have with your health care provider.

## 2015-02-18 ENCOUNTER — Encounter (HOSPITAL_COMMUNITY): Payer: Self-pay | Admitting: *Deleted

## 2015-02-18 ENCOUNTER — Emergency Department (HOSPITAL_COMMUNITY)
Admission: EM | Admit: 2015-02-18 | Discharge: 2015-02-18 | Disposition: A | Discharge status: Home / Self Care | Payer: Self-pay | Attending: Emergency Medicine | Admitting: Emergency Medicine

## 2015-02-18 DIAGNOSIS — Z791 Long term (current) use of non-steroidal anti-inflammatories (NSAID): Secondary | ICD-10-CM | POA: Insufficient documentation

## 2015-02-18 DIAGNOSIS — K047 Periapical abscess without sinus: Secondary | ICD-10-CM | POA: Insufficient documentation

## 2015-02-18 DIAGNOSIS — Z792 Long term (current) use of antibiotics: Secondary | ICD-10-CM | POA: Insufficient documentation

## 2015-02-18 MED ORDER — PENICILLIN G BENZATHINE 1200000 UNIT/2ML IM SUSP
1.2000 10*6.[IU] | Freq: Once | INTRAMUSCULAR | Status: AC
  Administered 2015-02-18: 1.2 10*6.[IU] via INTRAMUSCULAR
  Filled 2015-02-18: qty 2

## 2015-02-18 MED ORDER — TRAMADOL HCL 50 MG PO TABS: 50.0000 mg | ORAL_TABLET | Freq: Four times a day (QID) | ORAL | Status: DC | PRN

## 2015-02-18 MED ORDER — IBUPROFEN 800 MG PO TABS
800.0000 mg | ORAL_TABLET | Freq: Once | ORAL | Status: AC
  Administered 2015-02-18: 800 mg via ORAL
  Filled 2015-02-18: qty 1

## 2015-02-18 MED ORDER — DEXAMETHASONE 4 MG PO TABS
10.0000 mg | ORAL_TABLET | Freq: Once | ORAL | Status: AC
  Administered 2015-02-18: 10 mg via ORAL
  Filled 2015-02-18: qty 2
  Filled 2015-02-18: qty 1

## 2015-02-18 MED ORDER — IBUPROFEN 800 MG PO TABS: 800.0000 mg | ORAL_TABLET | Freq: Three times a day (TID) | ORAL | Status: AC | PRN

## 2015-02-18 MED ORDER — PENICILLIN V POTASSIUM 500 MG PO TABS: 500.0000 mg | ORAL_TABLET | Freq: Four times a day (QID) | ORAL | Status: AC

## 2015-02-18 NOTE — Discharge Instructions (Signed)
Dental Abscess °A dental abscess is a collection of infected fluid (pus) from a bacterial infection in the inner part of the tooth (pulp). It usually occurs at the end of the tooth's root.  °CAUSES  °· Severe tooth decay. °· Trauma to the tooth that allows bacteria to enter into the pulp, such as a broken or chipped tooth. °SYMPTOMS  °· Severe pain in and around the infected tooth. °· Swelling and redness around the abscessed tooth or in the mouth or face. °· Tenderness. °· Pus drainage. °· Bad breath. °· Bitter taste in the mouth. °· Difficulty swallowing. °· Difficulty opening the mouth. °· Nausea. °· Vomiting. °· Chills. °· Swollen neck glands. °DIAGNOSIS  °· A medical and dental history will be taken. °· An examination will be performed by tapping on the abscessed tooth. °· X-rays may be taken of the tooth to identify the abscess. °TREATMENT °The goal of treatment is to eliminate the infection. You may be prescribed antibiotic medicine to stop the infection from spreading. A root canal may be performed to save the tooth. If the tooth cannot be saved, it may be pulled (extracted) and the abscess may be drained.  °HOME CARE INSTRUCTIONS °· Only take over-the-counter or prescription medicines for pain, fever, or discomfort as directed by your caregiver. °· Rinse your mouth (gargle) often with salt water (¼ tsp salt in 8 oz [250 ml] of warm water) to relieve pain or swelling. °· Do not drive after taking pain medicine (narcotics). °· Do not apply heat to the outside of your face. °· Return to your dentist for further treatment as directed. °SEEK MEDICAL CARE IF: °· Your pain is not helped by medicine. °· Your pain is getting worse instead of better. °SEEK IMMEDIATE MEDICAL CARE IF: °· You have a fever or persistent symptoms for more than 2-3 days. °· You have a fever and your symptoms suddenly get worse. °· You have chills or a very bad headache. °· You have problems breathing or swallowing. °· You have trouble  opening your mouth. °· You have swelling in the neck or around the eye. °Document Released: 06/11/2005 Document Revised: 03/05/2012 Document Reviewed: 09/19/2010 °ExitCare® Patient Information ©2015 [employer]. This information is not intended to replace advice given to you by your health care provider. Make sure you discuss any questions you have with your health care provider. ° °Dental Care and Dentist Visits °Dental care supports good overall health. Regular dental visits can also help you avoid dental pain, bleeding, infection, and other more serious health problems in the future. It is important to keep the mouth healthy because diseases in the teeth, gums, and other oral tissues can spread to other areas of the body. Some problems, such as diabetes, heart disease, and pre-term labor have been associated with poor oral health.  °See your dentist every 6 months. If you experience emergency problems such as a toothache or broken tooth, go to the dentist right away. If you see your dentist regularly, you may catch problems early. It is easier to be treated for problems in the early stages.  °WHAT TO EXPECT AT A DENTIST VISIT  °Your dentist will look for many common oral health problems and recommend proper treatment. At your regular dental visit, you can expect: °· Gentle cleaning of the teeth and gums. This includes scraping and polishing. This helps to remove the sticky substance around the teeth and gums (plaque). Plaque forms in the mouth shortly after eating. Over time, plaque hardens   on the teeth as tartar. If tartar is not removed regularly, it can cause problems. Cleaning also helps remove stains. °· Periodic X-rays. These pictures of the teeth and supporting bone will help your dentist assess the health of your teeth. °· Periodic fluoride treatments. Fluoride is a natural mineral shown to help strengthen teeth. Fluoride treatment involves applying a fluoride gel or varnish to the teeth. It is most  commonly done in children. °· Examination of the mouth, tongue, jaws, teeth, and gums to look for any oral health problems, such as: °¨ Cavities (dental caries). This is decay on the tooth caused by plaque, sugar, and acid in the mouth. It is best to catch a cavity when it is small. °¨ Inflammation of the gums caused by plaque buildup (gingivitis). °¨ Problems with the mouth or malformed or misaligned teeth. °¨ Oral cancer or other diseases of the soft tissues or jaws.  °KEEP YOUR TEETH AND GUMS HEALTHY °For healthy teeth and gums, follow these general guidelines as well as your dentist's specific advice: °· Have your teeth professionally cleaned at the dentist every 6 months. °· Brush twice daily with a fluoride toothpaste. °· Floss your teeth daily.  °· Ask your dentist if you need fluoride supplements, treatments, or fluoride toothpaste. °· Eat a healthy diet. Reduce foods and drinks with added sugar. °· Avoid smoking. °TREATMENT FOR ORAL HEALTH PROBLEMS °If you have oral health problems, treatment varies depending on the conditions present in your teeth and gums. °· Your caregiver will most likely recommend good oral hygiene at each visit. °· For cavities, gingivitis, or other oral health disease, your caregiver will perform a procedure to treat the problem. This is typically done at a separate appointment. Sometimes your caregiver will refer you to another dental specialist for specific tooth problems or for surgery. °SEEK IMMEDIATE DENTAL CARE IF: °· You have pain, bleeding, or soreness in the gum, tooth, jaw, or mouth area. °· A permanent tooth becomes loose or separated from the gum socket. °· You experience a blow or injury to the mouth or jaw area. °Document Released: 02/21/2011 Document Revised: 09/03/2011 Document Reviewed: 02/21/2011 °ExitCare® Patient Information ©2015 [employer]. This information is not intended to replace advice given to you by your health care provider. Make sure you discuss any  questions you have with your health care provider. ° ° ° °Emergency Department Resource Guide °1) Find a Doctor and Pay Out of Pocket °Although you won't have to find out who is covered by your insurance plan, it is a good idea to ask around and get recommendations. You will then need to call the office and see if the doctor you have chosen will accept you as a new patient and what types of options they offer for patients who are self-pay. Some doctors offer discounts or will set up payment plans for their patients who do not have insurance, but you will need to ask so you aren't surprised when you get to your appointment. ° °2) Contact Your Local Health Department °Not all health departments have doctors that can see patients for sick visits, but many do, so it is worth a call to see if yours does. If you don't know where your local health department is, you can check in your phone book. The CDC also has a tool to help you locate your state's health department, and many state websites also have listings of all of their local health departments. ° °3) Find a Walk-in Clinic °If your illness is not   likely to be very severe or complicated, you may want to try a walk in clinic. These are popping up all over the country in pharmacies, drugstores, and shopping centers. They're usually staffed by nurse practitioners or physician assistants that have been trained to treat common illnesses and complaints. They're usually fairly quick and inexpensive. However, if you have serious medical issues or chronic medical problems, these are probably not your best option. ° °No Primary Care Doctor: °- Call [facility] at  832-8000 - they can help you locate a primary care doctor that  accepts your insurance, provides certain services, etc. °- Physician Referral Service- 1-800-533-3463 ° °Chronic Pain Problems: °Organization         Address  Phone   Notes  ° [facility]  (336) 297-2271 Patients need to be referred  by their primary care doctor.  ° °Medication Assistance: °Organization         Address  Phone   Notes  °[place] [facility] [address] °[place], [place] [address] (336) 641-8030 --Must be a resident of [place] °-- Must have NO insurance coverage whatsoever (no Medicaid/ Medicare, etc.) °-- The pt. MUST have a primary care doctor that directs their care regularly and follows them in the community °  °[facility]  (866) 331-1348   °[employer]  (888) 892-1162   ° °Agencies that provide inexpensive medical care: °Organization         Address  Phone   Notes  °[facility]  (336) 832-8035   °[facility]    (336) 832-7272   °[facility] [address] °[place], [place] [address] (336) 832-4777   °[facility] [address], °[place] (336) 271-4999   °[facility]    (336) 373-0678   °[facility]    (336) 272-1050   °[facility] ° [address], [place] Phone:  (336) 832-4444, Fax:  (336) 832-4440 Hours of Operation:  9 am - 6 pm, M-F.  Also accepts Medicaid/Medicare and self-pay.  °[facility] ° [address], [place] Phone: (336) 832-3150, Fax: (336) 832-3151. Hours of Operation:  8:30 am - 5:30 pm, M-F.  Also accepts Medicaid and self-pay.  °[facility] [place] [address], [place] Phone: (336) 878-6027   °[facility] [address], [place], [place] (336)723-1848, Ext. 123 Mondays & Thursdays: 7-9 AM.  First 15 patients are seen on a first come, first serve basis. °  ° °Medicaid-accepting [place] Providers: ° °Organization         Address  Phone   Notes  °[facility] [address], [place] (336) 641-2100 Also accepts self-pay patients.  °[facility] [address], [place] ° (336) 856-9996   °[facility] [address], [place] (336) 288-8857   °[facility] [address] [place] [address], [place] (336) 299-7000   °Veita Bland [address], [place]  ° (336) 373-1557 Only accepts [place] Access Medicaid patients after they have their name applied to their card.  ° °Self-Pay (no insurance) in [place]: ° °Organization         Address  Phone   Notes  °Sickle Cell Patients, [facility] [address], [place] (336) 832-1970   °[facility] [address], [place] (336) 832-4400   °[facility] ° [address], [place] (336) 992-4800   °[facility]/Dr. Osei-Bonsu ° [address] [place] [address], [place] or [address], [place] (336) 841-8500 Phone number for both [place] and [place] locations is the same.  °[facility] [address], [place] (336) 299-0000   °[facility] [address] [place] [address], [place] or [address] (336) 852-7530 °(336) 878-2260   °[facility] [address], [place] (336) 350-1642, phone; (336) 294-5005, fax Sees patients 1st and 3rd Saturday of every month.  Must not qualify for public or private insurance (i.e. Medicaid, Medicare, [place] Health Choice, Veterans' Benefits) • Household income should be no more than 200% of the poverty level •The clinic cannot treat you if you are pregnant or think you are pregnant • Sexually transmitted diseases are not treated at the clinic.  ° ° °Dental Care: °Organization         Address  Phone  Notes  °[place] [facility] [address], [place] (336) 641-6152 Accepts children up to age 21 who are enrolled in Medicaid or [place] Health Choice; pregnant women with a Medicaid card; and children who have applied for Medicaid or [place] Health Choice, but were declined, whose parents can pay a  reduced fee at time of service.  °[facility] [place]  [address], [place] (336) 641-7733 Accepts children up to age 21 who are enrolled in Medicaid or [place] Health Choice; pregnant women with a Medicaid card; and children who have applied for Medicaid or [place] Health Choice, but were declined, whose parents can pay a reduced fee at time of service.  °[facility] ° [address], [place] (336) 641-4533 Patients are seen by appointment only. Walk-ins are not accepted. [facility] will see patients 18 years of age and older. °Monday - Tuesday (8am-5pm) °Most Wednesdays (8:30-5pm) °$30 per visit, cash only  °[facility] ° [address], [place] (336) 641-4533 Patients are seen by appointment only. Walk-ins are not accepted. [facility] will see patients 18 years of age and older. °One Wednesday Evening (Monthly: Volunteer Based).  $30 per visit, cash only  °[facility]  (919) 537-3737 for adults; Children under age 4, call Graduate Pediatric Dentistry at ([phone]) [phone]. Children aged 4-14, please call (919) [phone] to request a pediatric application. ° Dental services are provided in all areas of dental care including fillings, crowns and bridges, complete and partial dentures, implants, gum treatment, root canals, and extractions. Preventive care is also provided. Treatment is provided to both adults and children. °Patients are selected via a lottery and there is often a waiting list. °  °[facility] [address], ° ° (336) 763-8833 [email or web address] °  °[facility] [address], [place], [place] (336)723-1848, Ext. 123 Second and Fourth Thursday of each month, opens at 6:30 AM; Clinic ends at 9 AM.  Patients are seen on a first-come first-served basis, and a limited number are seen during each clinic.  ° °[facility] ° [address], [place], [place] (336) 723-7904   Eligibility Requirements °You must have lived in [place], or [place] counties for at least the last three   months. °  You cannot be eligible for state or federal sponsored healthcare insurance, including Veterans Administration, Medicaid, or Medicare. °  You generally cannot be eligible for healthcare insurance through your employer.  °  How to apply: °Eligibility screenings are held every Tuesday and Wednesday afternoon from 1:00 pm until 4:00 pm. You do not need an appointment for the interview!  °[facility] [address], [place], [place] 336-631-2330   °[facility]  336-342-8273   °[facility]  336-703-3100   °[facility]  336-570-6415   ° °Behavioral Health Resources in the Community: °Intensive Outpatient Programs °Organization         Address  Phone  Notes  °[facility] [address], [place], [place] 336-878-6098   °[facility] [address], [place], [place] 336-832-9800   °[facility] [address], [place], [place] ° 336-882-2125   °[facility] [address],  °[place], [place] 1-800-853-5163 or 336-641-4981   °Substance Abuse Resources °Organization         Address  Phone  Notes  °[facility]  336-882-2125   °[facility]  336-784-9470   °[facility]  336-285-9073   °[facility]  336-845-3988   °Residential & Outpatient Substance Abuse Program  1-800-659-3381   °Psychological Services °Organization         Address  Phone  Notes  °[facility]  336- 832-9600   °[facility]  336- 378-7881   °[facility] [address], [place] 1-800-853-5163 or 336-641-4981   ° °Mobile Crisis Teams °Organization         Address  Phone  Notes  °[facility], Mobile Crisis Care Unit  1-877-626-1772    °Assertive °[facility] ° [address] [place], [place] 336-834-9664   °Sharon DeEsch [address] °[place] 336-554-5454   ° °Self-Help/Support Groups °Organization         Address  Phone             Notes  °[facility] - variety of support groups  336- 373-1402 Call for more information  °Narcotics Anonymous (NA), [facility] [address], °[place]  2 meetings at this location  ° °Residential Treatment Programs °Organization         Address  Phone  Notes  °[facility] [address],    °[place]  1-866-801-8205   °[facility] ° [address], [place], [place] 704-293-8524   °[facility] [address], [place] 336-845-3988 Admissions: 8am-3pm M-F  °[facility] [address]    °[place], [place] 336-841-1104   °[facility] [address], [place], [place] 336-379-7146   °[facility] [address]  °[place], [place] 336-285-9073   °[facility] Intensive Outpatient [address], [place], [place] 336-852-3033   °[facility] [address]  °[place], [place] 1-877-615-2722 or 336-784-9470   °[facility] [address] [place], [place] 336-227-7417 Accepts Medicaid  °[facility] [address]  ° [place] 1-800-659-3381 Substance Abuse/Addiction Treatment  ° °[place] Behavioral Health Resources °Organization         Address  Phone  Notes  °[facility]  (888) 581-9988   °Julie Brannon, PhD   [address] [place], [place]   (336) 349-5553 or (336) 951-0000   °[facility]   [address] °[place], [place] (336) 349-4454   °[facility] [address], [place], [place] (336) 342-8316 Insurance/Medicaid/sponsorship through [facility]  °[facility] [address]                                     [place], [place] (336) 342-8316 Therapy/tele-psych/case  °[facility] [address]  ° [place],  (336) 349-2233    °Dr. Arfeen  (336) 349-4544   °[facility] [place]  [employer] [facility] [place] [facility] 1) [address], [place] °2) [address], [place] °3)  [address], [place] (336) 349-3220 °(336) 342-7768 ° °(336) 342-8140   °[place] [facility] (336) 342-1394 or (336) 342-3537 (After Hours)    ° ° ° °

## 2015-02-18 NOTE — ED Provider Notes (Signed)
This chart was scribed for Austin Maw Shaelin Lalley, DO by Arlan Organ, ED Scribe. This patient was seen in room WTR1/WLPT1 and the patient's care was started 2:59 AM.   TIME SEEN: 2:59 AM   CHIEF COMPLAINT:  Chief Complaint  Patient presents with  . Dental Pain     HPI:  HPI Comments: Austin Alvarez is a 29 y.o. male without any pertinent past medical history who presents to the Emergency Department complaining of constant, ongoing, worsening lower dental pain with associated swelling to the R jaw x 2 days. Discomfort is made worse with chewing and palpation. No alleviating factors at this time. No OTC medications or home remedies attempted prior to arrival. No recent fever, chills, nausea, vomiting, shortness of breath, trouble swallowing, or inability to swallow. Austin Alvarez is followed by a dentist but has not arranged to make an appointment.  States he will call for an appt tomorrow.   ROS: See HPI Constitutional: no fever  Eyes: no drainage  ENT: no runny nose. Positive for dental problem   Cardiovascular:  no chest pain  Resp: no SOB  GI: no vomiting GU: no dysuria Integumentary: no rash  Allergy: no hives  Musculoskeletal: no leg swelling  Neurological: no slurred speech ROS otherwise negative  PAST MEDICAL HISTORY/PAST SURGICAL HISTORY:  No past medical history on file.  MEDICATIONS:  Prior to Admission medications   Medication Sig Start Date End Date Taking? Authorizing Provider  cephALEXin (KEFLEX) 500 MG capsule Take 1 capsule (500 mg total) by mouth 2 (two) times daily. 07/25/13   Jillyn Ledger, PA-C  diazepam (VALIUM) 5 MG tablet Take 1 tablet (5 mg total) by mouth every 8 (eight) hours as needed for anxiety or muscle spasms. 10/07/14   Tatyana Kirichenko, PA-C  hydrOXYzine (ATARAX/VISTARIL) 25 MG tablet Take 1 tablet (25 mg total) by mouth every 6 (six) hours. 08/01/13   Arthor Captain, PA-C  naproxen (NAPROSYN) 500 MG tablet Take 1 tablet (500 mg total) by mouth 2  (two) times daily. 10/07/14   Tatyana Kirichenko, PA-C  permethrin (ELIMITE) 5 % cream Apply 1 application topically once. 08/01/13   Arthor Captain, PA-C    ALLERGIES:  No Known Allergies  SOCIAL HISTORY:  Social History  Substance Use Topics  . Smoking status: Never Smoker   . Smokeless tobacco: Not on file  . Alcohol Use: No    FAMILY HISTORY: Family History  Problem Relation Age of Onset  . Hypertension Other     EXAM: BP 146/96 mmHg  Pulse 62  Temp(Src) 98.2 F (36.8 C)  Resp 18  SpO2 100% CONSTITUTIONAL: Alert and oriented and responds appropriately to questions. Well-appearing; well-nourished, afebrile, nontoxic, smiling, laughing HEAD: Normocephalic EYES: Conjunctivae clear, PERRL ENT: normal nose; no rhinorrhea; moist mucous membranes; pharynx without lesions noted, no tonsillar hypertrophy or exudate, no uvular deviation, no trismus or drooling, normal phonation, no stridor, swelling noted to the angle of the right jaw but does not go into the submandibular space, no Ludwig angina, tongue sits flat in the bottom of the mouth, patient is tender to palpation over his first right lower molar without signs of a drainable abscess, multiple dental caries NECK: Supple, no meningismus, no LAD; trachea midline, no subcutaneous air, full range of motion that is painless  CARD: RRR; S1 and S2 appreciated; no murmurs, no clicks, no rubs, no gallops RESP: Normal chest excursion without splinting or tachypnea; breath sounds clear and equal bilaterally; no wheezes, no rhonchi, no rales, no  hypoxia or respiratory distress, speaking full sentences ABD/GI: Normal bowel sounds; non-distended; soft, non-tender, no rebound, no guarding, no peritoneal signs BACK:  The back appears normal and is non-tender to palpation, there is no CVA tenderness EXT: Normal ROM in all joints; non-tender to palpation; no edema; normal capillary refill; no cyanosis, no calf tenderness or swelling    SKIN: Normal  color for age and race; warm NEURO: Moves all extremities equally, sensation to light touch intact diffusely, cranial nerves II through XII intact PSYCH: The patient's mood and manner are appropriate. Grooming and personal hygiene are appropriate.  MEDICAL DECISION MAKING: Patient here with right jaw swelling from likely dental abscess. No trismus or drooling. Normal phonation. Able to swallow his secretions without difficulty. No trismus. Will give dose of IM penicillin in the emergency department and discharge with penicillin, ibuprofen and tramadol. 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 No sign of Ludwig's angina on exam. Have discussed with him return precautions. He verbalizes understanding and is comfortable with this plan.    I personally performed the services described in this documentation, which was scribed in my presence. The recorded information has been reviewed and is accurate.   Austin Maw Jesyka Slaght, DO 02/18/15 [phone]

## 2015-02-18 NOTE — ED Notes (Signed)
Pt c/o dental pain and swelling to rt lower jaw area; pt states that it has only been a couple months since he had dental work in that area

## 2015-03-12 ENCOUNTER — Encounter (HOSPITAL_COMMUNITY): Payer: Self-pay | Admitting: Emergency Medicine

## 2015-03-12 ENCOUNTER — Emergency Department (HOSPITAL_COMMUNITY)
Admission: EM | Admit: 2015-03-12 | Discharge: 2015-03-12 | Disposition: A | Discharge status: Home / Self Care | Payer: Self-pay | Attending: Emergency Medicine | Admitting: Emergency Medicine

## 2015-03-12 DIAGNOSIS — Z720 Tobacco use: Secondary | ICD-10-CM | POA: Insufficient documentation

## 2015-03-12 DIAGNOSIS — K047 Periapical abscess without sinus: Secondary | ICD-10-CM

## 2015-03-12 DIAGNOSIS — Z792 Long term (current) use of antibiotics: Secondary | ICD-10-CM | POA: Insufficient documentation

## 2015-03-12 DIAGNOSIS — Z791 Long term (current) use of non-steroidal anti-inflammatories (NSAID): Secondary | ICD-10-CM | POA: Insufficient documentation

## 2015-03-12 MED ORDER — NAPROXEN 500 MG PO TABS: 500.0000 mg | ORAL_TABLET | Freq: Two times a day (BID) | ORAL | Status: DC

## 2015-03-12 MED ORDER — PENICILLIN V POTASSIUM 500 MG PO TABS
500.0000 mg | ORAL_TABLET | Freq: Once | ORAL | Status: AC
  Administered 2015-03-12: 500 mg via ORAL
  Filled 2015-03-12: qty 1

## 2015-03-12 MED ORDER — PENICILLIN V POTASSIUM 500 MG PO TABS: 500.0000 mg | ORAL_TABLET | Freq: Four times a day (QID) | ORAL | Status: DC

## 2015-03-12 MED ORDER — NAPROXEN 500 MG PO TABS
500.0000 mg | ORAL_TABLET | Freq: Once | ORAL | Status: AC
  Administered 2015-03-12: 500 mg via ORAL
  Filled 2015-03-12: qty 1

## 2015-03-12 NOTE — ED Notes (Signed)
Pt has right sided lower dental abscess x3 weeks. Was seen recently and was given an IM Penicillin shot. Was seen at the dentist with x-rays weeks ago and was told he could save it with a cap, but did not go back. No other c/c. C/o dental sensitivity on right lower side.

## 2015-03-12 NOTE — ED Provider Notes (Signed)
CSN: [id number]     Arrival date & time 03/12/15  1814 History  This chart was scribed for non-physician practitioner, Emilia Beck, PA-C working with Gilda Crease, MD by Freida Busman, ED Scribe. This patient was seen in room WTR9/WTR9 and the patient's care was started at 8:34 PM.  Chief Complaint  Patient presents with  . Abscess  . Dental Pain    The history is provided by the patient. No language interpreter was used.   HPI Comments:  Austin Alvarez is a 29 y.o. male who presents to the Emergency Department complaining of moderate right sided lower dental pain for 3 weeks with associated right sided facial swelling. He states the pain resolved on its own but returned yesterday. Pt believes he has an abscess but denies a h/o dental abscess. He has been taking Benadryl without relief. He denies drainage from the site and fever. No alleviating factors noted.  History reviewed. No pertinent past medical history. History reviewed. No pertinent past surgical history. Family History  Problem Relation Age of Onset  . Hypertension Other    Social History  Substance Use Topics  . Smoking status: Current Every Day Smoker -- 0.03 packs/day    Types: Cigarettes  . Smokeless tobacco: None  . Alcohol Use: No    Review of Systems  Constitutional: Negative for fever.  HENT: Positive for dental problem.   All other systems reviewed and are negative.  Allergies  Review of patient's allergies indicates no known allergies.  Home Medications   Prior to Admission medications   Medication Sig Start Date End Date Taking? Authorizing Provider  cephALEXin (KEFLEX) 500 MG capsule Take 1 capsule (500 mg total) by mouth 2 (two) times daily. 07/25/13   Jillyn Ledger, PA-C  diazepam (VALIUM) 5 MG tablet Take 1 tablet (5 mg total) by mouth every 8 (eight) hours as needed for anxiety or muscle spasms. 10/07/14   Tatyana Kirichenko, PA-C  hydrOXYzine (ATARAX/VISTARIL) 25 MG tablet Take 1  tablet (25 mg total) by mouth every 6 (six) hours. 08/01/13   Arthor Captain, PA-C  ibuprofen (ADVIL,MOTRIN) 800 MG tablet Take 1 tablet (800 mg total) by mouth every 8 (eight) hours as needed for mild pain. 02/18/15   Kristen N Ward, DO  naproxen (NAPROSYN) 500 MG tablet Take 1 tablet (500 mg total) by mouth 2 (two) times daily. 10/07/14   Tatyana Kirichenko, PA-C  permethrin (ELIMITE) 5 % cream Apply 1 application topically once. 08/01/13   Arthor Captain, PA-C  traMADol (ULTRAM) 50 MG tablet Take 1 tablet (50 mg total) by mouth every 6 (six) hours as needed. 02/18/15   Kristen N Ward, DO   BP 138/86 mmHg  Pulse 91  Temp(Src) 98.7 F (37.1 C) (Oral)  Resp 16  SpO2 100% Physical Exam  Constitutional: He is oriented to person, place, and time. He appears well-developed and well-nourished. No distress.  HENT:  Head: Normocephalic and atraumatic.  Fair dentition. Large area of fluctuance and tenderness to palpation with purulent drainage at the right lower gingival area.   Eyes: Conjunctivae and EOM are normal.  Neck: Normal range of motion. Neck supple.  Cardiovascular: Normal rate and regular rhythm.  Exam reveals no gallop and no friction rub.   No murmur heard. Pulmonary/Chest: Effort normal and breath sounds normal. He has no wheezes. He has no rales. He exhibits no tenderness.  Abdominal: Soft. He exhibits no distension. There is no tenderness.  Musculoskeletal: Normal range of motion.  Neurological: He  is alert and oriented to person, place, and time.  Speech is goal-oriented. Moves limbs without ataxia.   Skin: Skin is warm and dry.  Psychiatric: He has a normal mood and affect.  Nursing note and vitals reviewed.   ED Course  Procedures   DIAGNOSTIC STUDIES:  Oxygen Saturation is 100% on RA, normal by my interpretation.    COORDINATION OF CARE:  8:38 PM Discussed treatment plan with pt at bedside and pt agreed to plan.   INCISION AND DRAINAGE PROCEDURE NOTE: Patient  identification was confirmed and verbal consent was obtained. This procedure was performed by Emilia Beck, PA-C at 8:40 PM. Site: right lower gingiva Sterile procedures observed: yes Anesthetic used (type and amt): topical lollicaine Blade size: 11 Drainage: copious Complexity: Complex Packing used: none Site anesthetized, incision made over site, wound drained and explored loculations, rinsed with copious amounts of normal saline, wound packed with sterile gauze, covered with dry, sterile dressing.  Pt tolerated procedure well without complications.  Instructions for care discussed verbally and pt provided with additional written instructions for homecare and f/u.  Labs Review Labs Reviewed - No data to display  Imaging Review No results found. I have personally reviewed and evaluated these images and lab results as part of my medical decision-making.   EKG Interpretation None      MDM   Final diagnoses:  Dental abscess   Abscess drained without difficulty. Vitals stable and patient afebrile. Patient discharged with Veetid and pain medication.   I personally performed the services described in this documentation, which was scribed in my presence. The recorded information has been reviewed and is accurate.    Emilia Beck, PA-C 03/13/15 0045  Gilda Crease, MD 03/13/15 2312

## 2015-03-12 NOTE — Discharge Instructions (Signed)
Take penicllin as directed until gone. Take naprosyn as needed for pain. Refer to attached documents for more information.

## 2015-05-28 ENCOUNTER — Emergency Department (HOSPITAL_COMMUNITY)
Admission: EM | Admit: 2015-05-28 | Discharge: 2015-05-28 | Disposition: A | Discharge status: Home / Self Care | Payer: Self-pay | Attending: Emergency Medicine | Admitting: Emergency Medicine

## 2015-05-28 ENCOUNTER — Encounter (HOSPITAL_COMMUNITY): Payer: Self-pay | Admitting: Emergency Medicine

## 2015-05-28 DIAGNOSIS — L0291 Cutaneous abscess, unspecified: Secondary | ICD-10-CM

## 2015-05-28 DIAGNOSIS — Z791 Long term (current) use of non-steroidal anti-inflammatories (NSAID): Secondary | ICD-10-CM | POA: Insufficient documentation

## 2015-05-28 DIAGNOSIS — Z792 Long term (current) use of antibiotics: Secondary | ICD-10-CM | POA: Insufficient documentation

## 2015-05-28 DIAGNOSIS — F17210 Nicotine dependence, cigarettes, uncomplicated: Secondary | ICD-10-CM | POA: Insufficient documentation

## 2015-05-28 DIAGNOSIS — H6002 Abscess of left external ear: Secondary | ICD-10-CM | POA: Insufficient documentation

## 2015-05-28 MED ORDER — IBUPROFEN 800 MG PO TABS
800.0000 mg | ORAL_TABLET | Freq: Once | ORAL | Status: AC
  Administered 2015-05-28: 800 mg via ORAL
  Filled 2015-05-28: qty 1

## 2015-05-28 MED ORDER — SULFAMETHOXAZOLE-TRIMETHOPRIM 800-160 MG PO TABS: 1.0000 | ORAL_TABLET | Freq: Two times a day (BID) | ORAL | Status: AC

## 2015-05-28 MED ORDER — LIDOCAINE HCL 1 % IJ SOLN
20.0000 mL | Freq: Once | INTRAMUSCULAR | Status: AC
  Administered 2015-05-28: 20 mL via INTRADERMAL
  Filled 2015-05-28: qty 20

## 2015-05-28 NOTE — Discharge Instructions (Signed)
1. Medications: bactrim, usual home medications 2. Treatment: rest, drink plenty of fluids, change dressing daily and keep clean and dry, use warm compresses 3. Follow Up: please followup with your primary doctor in 2 days for wound re-check; if you do not have a primary care doctor use the resource guide provided to find one; please return to the ER for high fever, severe pain or swelling, increased redness   Abscess An abscess is an infected area that contains a collection of pus and debris.It can occur in almost any part of the body. An abscess is also known as a furuncle or boil. CAUSES  An abscess occurs when tissue gets infected. This can occur from blockage of oil or sweat glands, infection of hair follicles, or a minor injury to the skin. As the body tries to fight the infection, pus collects in the area and creates pressure under the skin. This pressure causes pain. People with weakened immune systems have difficulty fighting infections and get certain abscesses more often.  SYMPTOMS Usually an abscess develops on the skin and becomes a painful mass that is red, warm, and tender. If the abscess forms under the skin, you may feel a moveable soft area under the skin. Some abscesses break open (rupture) on their own, but most will continue to get worse without care. The infection can spread deeper into the body and eventually into the bloodstream, causing you to feel ill.  DIAGNOSIS  Your caregiver will take your medical history and perform a physical exam. A sample of fluid may also be taken from the abscess to determine what is causing your infection. TREATMENT  Your caregiver may prescribe antibiotic medicines to fight the infection. However, taking antibiotics alone usually does not cure an abscess. Your caregiver may need to make a small cut (incision) in the abscess to drain the pus. In some cases, gauze is packed into the abscess to reduce pain and to continue draining the area. HOME  CARE INSTRUCTIONS   Only take over-the-counter or prescription medicines for pain, discomfort, or fever as directed by your caregiver.  If you were prescribed antibiotics, take them as directed. Finish them even if you start to feel better.  If gauze is used, follow your caregiver's directions for changing the gauze.  To avoid spreading the infection:  Keep your draining abscess covered with a bandage.  Wash your hands well.  Do not share personal care items, towels, or whirlpools with others.  Avoid skin contact with others.  Keep your skin and clothes clean around the abscess.  Keep all follow-up appointments as directed by your caregiver. SEEK MEDICAL CARE IF:   You have increased pain, swelling, redness, fluid drainage, or bleeding.  You have muscle aches, chills, or a general ill feeling.  You have a fever. MAKE SURE YOU:   Understand these instructions.  Will watch your condition.  Will get help right away if you are not doing well or get worse.   This information is not intended to replace advice given to you by your health care provider. Make sure you discuss any questions you have with your health care provider.   Document Released: 03/21/2005 Document Revised: 12/11/2011 Document Reviewed: 08/24/2011 Elsevier Interactive Patient Education 2016 Else[employer]  Incision and Drainage Incision and drainage is a procedure in which a sac-like structure (cystic structure) is opened and drained. The area to be drained usually contains material such as pus, fluid, or blood.  LET YOUR CAREGIVER KNOW ABOUT:  Allergies to medicine.  Medicines taken, including vitamins, herbs, eyedrops, over-the-counter medicines, and creams.  Use of steroids (by mouth or creams).  Previous problems with anesthetics or numbing medicines.  History of bleeding problems or blood clots.  Previous surgery.  Other health problems, including diabetes and kidney problems.  Possibility  of pregnancy, if this applies. RISKS AND COMPLICATIONS  Pain.  Bleeding.  Scarring.  Infection. BEFORE THE PROCEDURE  You may need to have an ultrasound or other imaging tests to see how large or deep your cystic structure is. Blood tests may also be used to determine if you have an infection or how severe the infection is. You may need to have a tetanus shot. PROCEDURE  The affected area is cleaned with a cleaning fluid. The cyst area will then be numbed with a medicine (local anesthetic). A small incision will be made in the cystic structure. A syringe or catheter may be used to drain the contents of the cystic structure, or the contents may be squeezed out. The area will then be flushed with a cleansing solution. After cleansing the area, it is often gently packed with a gauze or another wound dressing. Once it is packed, it will be covered with gauze and tape or some other type of wound dressing. AFTER THE PROCEDURE   Often, you will be allowed to go home right after the procedure.  You may be given antibiotic medicine to prevent or heal an infection.  If the area was packed with gauze or some other wound dressing, you will likely need to come back in 1 to 2 days to get it removed.  The area should heal in about 14 days.   This information is not intended to replace advice given to you by your health care provider. Make sure you discuss any questions you have with your health care provider.   Document Released: 12/05/2000 Document Revised: 12/11/2011 Document Reviewed: 08/06/2011 Elsevier Interactive Patient Education [employer] Else[employer] Inc[employer]

## 2015-05-28 NOTE — ED Notes (Signed)
Per pt, states bump behind left ear-popped it and got something out of it

## 2015-05-28 NOTE — ED Provider Notes (Signed)
CSN: [id number]     Arrival date & time 05/28/15  1743 History  By signing my name below, I, Austin Alvarez, attest that this documentation has been prepared under the direction and in the presence of [name], [place]. Electronically Signed: Elon Alvarez ED Scribe. 05/28/2015. 7:03 PM.    Chief Complaint  Patient presents with  . Mass    The history is provided by the patient. No language interpreter was used.    HPI Comments: Austin Alvarez is a 29 y.o. male who presents to the Emergency Department complaining of a gradually worsening, moderately painful, pus draining abscess on the left ear onset 1 week ago. There are no aggravating/alleviating factors. Patient has not tried any treatments. He reports prior hx of abscess in a different location. He denies fever, chills, inner ear pain, other complaints.   History reviewed. No pertinent past medical history. History reviewed. No pertinent past surgical history. Family History  Problem Relation Age of Onset  . Hypertension Other    Social History  Substance Use Topics  . Smoking status: Current Every Day Smoker -- 0.03 packs/day    Types: Cigarettes  . Smokeless tobacco: None  . Alcohol Use: No     Review of Systems  Constitutional: Negative for fever and chills.  HENT: Positive for ear pain. Negative for sore throat.   Skin: Positive for wound.      Allergies  Review of patient's allergies indicates no known allergies.  Home Medications   Prior to Admission medications   Medication Sig Start Date End Date Taking? Authorizing Provider  cephALEXin (KEFLEX) 500 MG capsule Take 1 capsule (500 mg total) by mouth 2 (two) times daily. 07/25/13   Jillyn Ledger, PA-C  diazepam (VALIUM) 5 MG tablet Take 1 tablet (5 mg total) by mouth every 8 (eight) hours as needed for anxiety or muscle spasms. 10/07/14   Tatyana Kirichenko, PA-C  hydrOXYzine (ATARAX/VISTARIL) 25 MG tablet Take 1 tablet (25 mg total) by mouth every 6  (six) hours. 08/01/13   Arthor Captain, PA-C  ibuprofen (ADVIL,MOTRIN) 800 MG tablet Take 1 tablet (800 mg total) by mouth every 8 (eight) hours as needed for mild pain. 02/18/15   Kristen N Ward, DO  naproxen (NAPROSYN) 500 MG tablet Take 1 tablet (500 mg total) by mouth 2 (two) times daily with a meal. 03/12/15   Emilia Beck, PA-C  penicillin v potassium (VEETID) 500 MG tablet Take 1 tablet (500 mg total) by mouth 4 (four) times daily. 03/12/15   Kaitlyn Szekalski, PA-C  permethrin (ELIMITE) 5 % cream Apply 1 application topically once. 08/01/13   Arthor Captain, PA-C  sulfamethoxazole-trimethoprim (BACTRIM DS,SEPTRA DS) 800-160 MG tablet Take 1 tablet by mouth 2 (two) times daily. 05/28/15 06/04/15  Mady Gemma, PA-C  traMADol (ULTRAM) 50 MG tablet Take 1 tablet (50 mg total) by mouth every 6 (six) hours as needed. 02/18/15   Kristen N Ward, DO    BP 160/115 mmHg  Pulse 60  Temp(Src) 98.2 F (36.8 C) (Oral)  Resp 16  SpO2 100% Physical Exam  Constitutional: He is oriented to person, place, and time. He appears well-developed and well-nourished. No distress.  HENT:  Head: Normocephalic and atraumatic.  Right Ear: Hearing, tympanic membrane, external ear and ear canal normal.  Left Ear: Hearing, tympanic membrane, external ear and ear canal normal.  Nose: Nose normal.  Mouth/Throat: Uvula is midline, oropharynx is clear and moist and mucous membranes are normal. No oropharyngeal exudate, posterior oropharyngeal  edema, posterior oropharyngeal erythema or tonsillar abscesses.  1 cm area of fluctuance posterior to left ear.  Eyes: Conjunctivae and EOM are normal. Pupils are equal, round, and reactive to light. Right eye exhibits no discharge. Left eye exhibits no discharge. No scleral icterus.  Neck: Normal range of motion. Neck supple.  Cardiovascular: Normal rate and regular rhythm.   Pulmonary/Chest: Effort normal and breath sounds normal. No respiratory distress.  Musculoskeletal:  Normal range of motion. He exhibits no edema or tenderness.  Neurological: He is alert and oriented to person, place, and time.  Skin: Skin is warm and dry. He is not diaphoretic.  Psychiatric: He has a normal mood and affect. His behavior is normal.  Nursing note and vitals reviewed.   ED Course  Procedures (including critical care time)  DIAGNOSTIC STUDIES: Oxygen Saturation is 100% on RA, normal by my interpretation.    COORDINATION OF CARE: 7:05 PM Discussed plans to perform I&D.  Patient acknowledges and agrees with plan.    INCISION AND DRAINAGE PROCEDURE NOTE: Patient identification was confirmed and verbal consent was obtained. This procedure was performed by Glean HessElizabeth Liyana Suniga, PA-C at 7:50 PM. Site: posterior aspect of left ear Sterile procedures observed Needle size: 27 Anesthetic used (type and amt): 2 cc 1% lidocaine Blade size: 11 Drainage: Scant Complexity: Complex Packing not used Site anesthetized, incision made over site, wound drained and explored loculations, rinsed with copious amounts of normal saline, wound packed with sterile gauze, covered with dry, sterile dressing.  Pt tolerated procedure well without complications.  Instructions for care discussed verbally and pt provided with additional written instructions for homecare and f/u.   Labs Review Labs Reviewed - No data to display  Imaging Review No results found.    EKG Interpretation None      MDM   Final diagnoses:  Abscess    29 year old male presents with abscess posterior to left ear x 1 week. Reports associated purulent drainage. Denies fever or inner ear pain. Patient is afebrile. On exam, patient has well-circumscribed 1 cm area of fluctuance posterior to left ear. TMs clear bilaterally. Posterior oropharynx without edema, erythema, or exudate.   Patient with skin abscess amenable to incision and drainage. Abscess was not large enough to warrant packing or drain, wound recheck in  2 days. Encouraged home warm soaks and flushing. Will discharge with bactrim. Return precautions discussed.  BP elevated in the ED, however no signs of hypertensive urgency or emergency. Patient denies headache, dizziness, lightheadedness, vision changes, chest pain, shortness of breath. Advised patient to follow-up with PCP for further evaluation and management of elevated BP (patient states he has had elevated BP ever since he was a child, however has never been diagnosed with or treated for HTN).   BP 160/115 mmHg  Pulse 60  Temp(Src) 98.2 F (36.8 C) (Oral)  Resp 16  SpO2 100%  I personally performed the services described in this documentation, which was scribed in my presence. The recorded information has been reviewed and is accurate.     Mady Gemmalizabeth C Haydan Wedig, PA-C 05/28/15 2345  Lorre NickAnthony Allen, MD 05/28/15 [phone]

## 2015-09-29 ENCOUNTER — Emergency Department (HOSPITAL_COMMUNITY): Admission: EM | Admit: 2015-09-29 | Discharge: 2015-09-29 | Discharge status: Against Medical Advice | Payer: Self-pay

## 2015-09-29 NOTE — ED Notes (Signed)
No answer when pt's name called in the waiting room 

## 2015-09-29 NOTE — ED Notes (Signed)
No answer when pt's name called for triage  

## 2015-09-29 NOTE — ED Notes (Signed)
No answer when pt's name called in the waiting room; pt eloped from waiting room prior to triage

## 2016-06-04 ENCOUNTER — Encounter (HOSPITAL_COMMUNITY): Payer: Self-pay

## 2016-06-04 DIAGNOSIS — Z5321 Procedure and treatment not carried out due to patient leaving prior to being seen by health care provider: Secondary | ICD-10-CM | POA: Insufficient documentation

## 2016-06-04 DIAGNOSIS — R51 Headache: Secondary | ICD-10-CM | POA: Insufficient documentation

## 2016-06-04 MED ORDER — OXYCODONE-ACETAMINOPHEN 5-325 MG PO TABS
ORAL_TABLET | ORAL | Status: AC
  Filled 2016-06-04: qty 1

## 2016-06-04 MED ORDER — OXYCODONE-ACETAMINOPHEN 5-325 MG PO TABS
1.0000 | ORAL_TABLET | ORAL | Status: DC | PRN
  Administered 2016-06-04: 1 via ORAL

## 2016-06-04 NOTE — ED Triage Notes (Signed)
Pt endorses headache x 3 days with light sensitivity and chills. Pt afebrile here. Pt also endorses diarrhea for 2 days recently after "eating bad chinese food" Pt denies n/v. Neuro exam normal.

## 2016-06-05 ENCOUNTER — Emergency Department (HOSPITAL_COMMUNITY)
Admission: EM | Admit: 2016-06-05 | Discharge: 2016-06-05 | Disposition: A | Discharge status: Home / Self Care | Payer: Self-pay | Attending: Emergency Medicine | Admitting: Emergency Medicine

## 2016-06-05 HISTORY — DX: Essential (primary) hypertension: I10

## 2016-06-05 NOTE — ED Notes (Signed)
Pt was called to re-vital, no answer.

## 2016-06-05 NOTE — ED Notes (Signed)
Pt called multiple times to reassess and called to go back to room and did not answer. LWBS after triage.

## 2017-05-06 ENCOUNTER — Encounter (HOSPITAL_COMMUNITY): Payer: Self-pay | Admitting: Emergency Medicine

## 2017-05-06 ENCOUNTER — Emergency Department (HOSPITAL_COMMUNITY)
Admission: EM | Admit: 2017-05-06 | Discharge: 2017-05-06 | Disposition: A | Discharge status: Home / Self Care | Payer: Self-pay | Attending: Emergency Medicine | Admitting: Emergency Medicine

## 2017-05-06 DIAGNOSIS — L0291 Cutaneous abscess, unspecified: Secondary | ICD-10-CM

## 2017-05-06 DIAGNOSIS — I10 Essential (primary) hypertension: Secondary | ICD-10-CM | POA: Insufficient documentation

## 2017-05-06 DIAGNOSIS — L03116 Cellulitis of left lower limb: Secondary | ICD-10-CM | POA: Insufficient documentation

## 2017-05-06 DIAGNOSIS — L02416 Cutaneous abscess of left lower limb: Secondary | ICD-10-CM | POA: Insufficient documentation

## 2017-05-06 MED ORDER — SULFAMETHOXAZOLE-TRIMETHOPRIM 800-160 MG PO TABS: 1.0000 | ORAL_TABLET | Freq: Two times a day (BID) | ORAL | 0 refills | Status: AC

## 2017-05-06 MED ORDER — LIDOCAINE-EPINEPHRINE (PF) 2 %-1:200000 IJ SOLN
20.0000 mL | Freq: Once | INTRAMUSCULAR | Status: AC
  Administered 2017-05-06: 20 mL
  Filled 2017-05-06: qty 20

## 2017-05-06 MED ORDER — CEPHALEXIN 500 MG PO CAPS: 500.0000 mg | ORAL_CAPSULE | Freq: Four times a day (QID) | ORAL | 0 refills | Status: AC

## 2017-05-06 NOTE — Discharge Instructions (Signed)
1. Medications: bactrim and keflex, usual home medications 2. Treatment: rest, drink plenty of fluids, use warm compresses, flush abscess with warm water several times per day 3. Follow Up: Please followup with your primary doctor in 2-3 days for discussion of your diagnoses and further evaluation after today's visit; if you do not have a primary care doctor use the resource guide provided to find one; Please return to the ER for fevers, chills, nausea, vomiting or other signs of infection

## 2017-05-06 NOTE — ED Provider Notes (Signed)
[facility] EMERGENCY [facility]PART[facility] Provider Note   CSN: [id number] Arrival date & time: 05/06/17  [id number]     History   Chief Complaint Chief Complaint  Patient presents with  . Abscess    HPI Austin Alvarez is a 31 y.o. male with a hx of HTN presents to the Emergency Department complaining of gradual, persistent, progressively worsening only, erythema and pain to the left shin onset 3 days ago.  Patient reports he had a similar but smaller episode at the same site approximately 3 months ago.  He reports he squeezed it at that time with expression of purulent drainage and site healed on its own.  Patient reports today he attempted to squeeze the site but no expression of drainage.  Patient denies fevers or chills, nausea or vomiting.  He denies history of immunocompromise, diabetes, HIV.  No treatments prior to arrival.  Palpation makes the symptoms significantly worse.  Nothing makes them better.  HPI  Past Medical History:  Diagnosis Date  . Hypertension     There are no active problems to display for this patient.   History reviewed. No pertinent surgical history.     Home Medications    Prior to Admission medications   Medication Sig Start Date End Date Taking? Authorizing Provider  cephALEXin (KEFLEX) 500 MG capsule Take 1 capsule (500 mg total) 4 (four) times daily by mouth. 05/06/17   Keyairra Kolinski, Dahlia ClientHannah, PA-C  diazepam (VALIUM) 5 MG tablet Take 1 tablet (5 mg total) by mouth every 8 (eight) hours as needed for anxiety or muscle spasms. 10/07/14   Kirichenko, Lemont Fillersatyana, PA-C  hydrOXYzine (ATARAX/VISTARIL) 25 MG tablet Take 1 tablet (25 mg total) by mouth every 6 (six) hours. 08/01/13   Arthor CaptainHarris, Abigail, PA-C  ibuprofen (ADVIL,MOTRIN) 800 MG tablet Take 1 tablet (800 mg total) by mouth every 8 (eight) hours as needed for mild pain. 02/18/15   Ward, Layla MawKristen N, DO  naproxen (NAPROSYN) 500 MG tablet Take 1 tablet (500 mg total) by mouth 2 (two) times daily with  a meal. 03/12/15   Szekalski, Kaitlyn, PA-C  penicillin v potassium (VEETID) 500 MG tablet Take 1 tablet (500 mg total) by mouth 4 (four) times daily. 03/12/15   Emilia BeckSzekalski, Kaitlyn, PA-C  permethrin (ELIMITE) 5 % cream Apply 1 application topically once. 08/01/13   Arthor CaptainHarris, Abigail, PA-C  sulfamethoxazole-trimethoprim (BACTRIM DS,SEPTRA DS) 800-160 MG tablet Take 1 tablet 2 (two) times daily for 7 days by mouth. 05/06/17 05/13/17  Lamel Mccarley, Dahlia ClientHannah, PA-C  traMADol (ULTRAM) 50 MG tablet Take 1 tablet (50 mg total) by mouth every 6 (six) hours as needed. 02/18/15   Ward, Layla MawKristen N, DO    Family History Family History  Problem Relation Age of Onset  . Hypertension Other     Social History Social History   Tobacco Use  . Smoking status: Never Smoker  . Smokeless tobacco: Never Used  Substance Use Topics  . Alcohol use: No  . Drug use: Yes    Types: Marijuana    Comment: occ      Allergies   Patient has no known allergies.   Review of Systems Review of Systems  Constitutional: Negative for chills and fever.  Gastrointestinal: Negative for nausea and vomiting.  Endocrine: Negative for polydipsia, polyphagia and polyuria.  Skin:       Abscess  Allergic/Immunologic: Negative for immunocompromised state.  Hematological: Does not bruise/bleed easily.  Psychiatric/Behavioral: The patient is not nervous/anxious.      Physical Exam Updated  Vital Signs BP 140/88 (BP Location: Right Arm)   Pulse 88   Temp 98.4 F (36.9 C) (Oral)   Resp 16   SpO2 100%   Physical Exam  Constitutional: He is oriented to person, place, and time. He appears well-developed and well-nourished. No distress.  HENT:  Head: Normocephalic and atraumatic.  Eyes: Conjunctivae are normal. No scleral icterus.  Neck: Normal range of motion.  Cardiovascular: Normal rate, regular rhythm and intact distal pulses.  Pulmonary/Chest: Effort normal and breath sounds normal.  Abdominal: Soft. He exhibits no  distension. There is no tenderness.  Lymphadenopathy:    He has no cervical adenopathy.  Neurological: He is alert and oriented to person, place, and time.  Skin: Skin is warm and dry. He is not diaphoretic. There is erythema.  Large areas of erythema, induration with central fluctuance located on the central left shin  Psychiatric: He has a normal mood and affect.  Nursing note and vitals reviewed.    ED Treatments / Results  Labs (all labs ordered are listed, but only abnormal results are displayed) Labs Reviewed - No data to display  EKG  EKG Interpretation None       Radiology No results found.  Procedures .Marland KitchenIncision and Drainage Date/Time: 05/06/2017 2:55 AM Performed by: Dierdre Forth, PA-C Authorized by: Dierdre Forth, PA-C   Consent:    Consent obtained:  Verbal   Consent given by:  Patient   Risks discussed:  Bleeding, incomplete drainage and infection   Alternatives discussed:  Alternative treatment (abx only) Location:    Type:  Abscess   Location:  Lower extremity   Lower extremity location:  Leg   Leg location:  L lower leg Pre-procedure details:    Skin preparation:  Betadine Anesthesia (see MAR for exact dosages):    Anesthesia method:  Local infiltration   Local anesthetic:  Lidocaine 2% WITH epi (5mL) Procedure type:    Complexity:  Complex Procedure details:    Incision types:  Single straight   Scalpel blade:  11   Wound management:  Probed and deloculated and irrigated with saline   Drainage:  Purulent and bloody   Drainage amount:  Moderate   Wound treatment:  Wound left open   Packing materials:  None Post-procedure details:    Patient tolerance of procedure:  Tolerated well, no immediate complications   EMERGENCY DEPARTMENT US SOFT TISSUE INTERPRETATION "Study: Limited Soft Tissue Ultrasound"  INDICATIONS: Pain and Soft tissue infection Multiple views of the body part were obtained in real-time with a  multi-frequency linear probe  PERFORMED BY: Myself IMAGES ARCHIVED?: Yes SIDE:Left BODY PART:Lower extremity INTERPRETATION:  Abcess present and Cellulitis present      (including critical care time)  .  Medications Ordered in ED Medications  lidocaine-EPINEPHrine (XYLOCAINE W/EPI) 2 %-1:200000 (PF) injection 20 mL (20 mLs Infiltration Given 05/06/17 0223)     Initial Impression / Assessment and Plan / ED Course  I have reviewed the triage vital signs and the nursing notes.  Pertinent labs & imaging results that were available during my care of the patient were reviewed by me and considered in my medical decision making (see chart for details).     Patient with skin abscess amenable to incision and drainage.  Abscess was not large enough to warrant packing or drain,  wound recheck in 2 days. Encouraged home warm soaks and flushing.  Moderate signs of cellulitis is surrounding skin.  Will d/c to home with bactrim and keflex as pt  reports previous abscesses.     Final Clinical Impressions(s) / ED Diagnoses   Final diagnoses:  Abscess  Cellulitis of left lower extremity    ED Discharge Orders        Ordered    cephALEXin (KEFLEX) 500 MG capsule  4 times daily     05/06/17 0254    sulfamethoxazole-trimethoprim (BACTRIM DS,SEPTRA DS) 800-160 MG tablet  2 times daily     05/06/17 0254       Chinyere Galiano, Dahlia ClientHannah, PA-C 05/06/17 0259    Gilda CreasePollina, Christopher J, MD 05/06/17 [id number]

## 2017-05-06 NOTE — ED Triage Notes (Signed)
Patient reports skin abscess with swelling/ no drainage onset last week at left shin .

## 2017-10-03 ENCOUNTER — Encounter (HOSPITAL_COMMUNITY): Payer: Self-pay | Admitting: *Deleted

## 2017-10-03 ENCOUNTER — Emergency Department (HOSPITAL_COMMUNITY)
Admission: EM | Admit: 2017-10-03 | Discharge: 2017-10-03 | Disposition: A | Discharge status: Home / Self Care | Payer: Self-pay | Attending: Emergency Medicine | Admitting: Emergency Medicine

## 2017-10-03 DIAGNOSIS — Z202 Contact with and (suspected) exposure to infections with a predominantly sexual mode of transmission: Secondary | ICD-10-CM

## 2017-10-03 DIAGNOSIS — Z711 Person with feared health complaint in whom no diagnosis is made: Secondary | ICD-10-CM | POA: Insufficient documentation

## 2017-10-03 LAB — URINALYSIS, ROUTINE W REFLEX MICROSCOPIC
BILIRUBIN URINE: NEGATIVE
Bacteria, UA: NONE SEEN
GLUCOSE, UA: NEGATIVE mg/dL
Hgb urine dipstick: NEGATIVE
KETONES UR: NEGATIVE mg/dL
NITRITE: NEGATIVE
PH: 5 (ref 5.0–8.0)
Protein, ur: NEGATIVE mg/dL
Specific Gravity, Urine: 1.03 (ref 1.005–1.030)
Squamous Epithelial / LPF: NONE SEEN

## 2017-10-03 MED ORDER — METRONIDAZOLE 500 MG PO TABS
2000.0000 mg | ORAL_TABLET | Freq: Once | ORAL | Status: AC
  Administered 2017-10-03: 2000 mg via ORAL
  Filled 2017-10-03: qty 4

## 2017-10-03 NOTE — ED Triage Notes (Signed)
Pt states that he needs to be tested for STD as a partner notified him that she was postitive for trich.  Pt denies any symptoms

## 2017-10-03 NOTE — ED Provider Notes (Signed)
[facility] EMERGENCY DEPARTMENT Provider Note   CSN: [id number] Arrival date & time: 10/03/17  1821     History   Chief Complaint Chief Complaint  Patient presents with  . Exposure to STD    HPI Austin TROUTEN is a 32 y.o. male.  Austin Alvarez is a 32 y.o. Male with history of hypertension, who presents to the ED for STD testing.  Patient reports he was notified by a partner today that they tested positive for trichomoniasis and he needed to be tested.  Patient denies any penile discharge or pain, he denies any dysuria or frequency.  Patient denies any genital lesions or bumps.  No fevers or chills, nausea, vomiting abdominal pain, or pain with defecation.  Patient reports he uses protection sometimes.  Reports he is unsure of partner tested positive for anything else.     Past Medical History:  Diagnosis Date  . Hypertension     There are no active problems to display for this patient.   History reviewed. No pertinent surgical history.      Home Medications    Prior to Admission medications   Medication Sig Start Date End Date Taking? Authorizing Provider  cephALEXin (KEFLEX) 500 MG capsule Take 1 capsule (500 mg total) 4 (four) times daily by mouth. 05/06/17   Muthersbaugh, Dahlia Client, PA-C  diazepam (VALIUM) 5 MG tablet Take 1 tablet (5 mg total) by mouth every 8 (eight) hours as needed for anxiety or muscle spasms. 10/07/14   Kirichenko, Lemont Fillers, PA-C  hydrOXYzine (ATARAX/VISTARIL) 25 MG tablet Take 1 tablet (25 mg total) by mouth every 6 (six) hours. 08/01/13   Arthor Captain, PA-C  ibuprofen (ADVIL,MOTRIN) 800 MG tablet Take 1 tablet (800 mg total) by mouth every 8 (eight) hours as needed for mild pain. 02/18/15   Ward, Layla Maw, DO  naproxen (NAPROSYN) 500 MG tablet Take 1 tablet (500 mg total) by mouth 2 (two) times daily with a meal. 03/12/15   Szekalski, Kaitlyn, PA-C  penicillin v potassium (VEETID) 500 MG tablet Take 1 tablet (500 mg total)  by mouth 4 (four) times daily. 03/12/15   Emilia Beck, PA-C  permethrin (ELIMITE) 5 % cream Apply 1 application topically once. 08/01/13   Arthor Captain, PA-C  traMADol (ULTRAM) 50 MG tablet Take 1 tablet (50 mg total) by mouth every 6 (six) hours as needed. 02/18/15   Ward, Layla Maw, DO    Family History Family History  Problem Relation Age of Onset  . Hypertension Other     Social History Social History   Tobacco Use  . Smoking status: Never Smoker  . Smokeless tobacco: Never Used  Substance Use Topics  . Alcohol use: No  . Drug use: Yes    Types: Marijuana    Comment: occ      Allergies   Patient has no known allergies.   Review of Systems Review of Systems  Constitutional: Negative for chills and fever.  Gastrointestinal: Negative for abdominal pain, nausea and vomiting.  Genitourinary: Negative for discharge, dysuria, frequency, genital sores, hematuria, penile pain, penile swelling, scrotal swelling and testicular pain.     Physical Exam Updated Vital Signs BP 121/83 (BP Location: Right Arm)   Pulse 64   Temp 99 F (37.2 C) (Oral)   Resp 14   Wt 72.6 kg (160 lb)   SpO2 99%   BMI 23.63 kg/m   Physical Exam  Constitutional: He appears well-developed and well-nourished. No distress.  HENT:  Head:  Normocephalic and atraumatic.  Eyes: Right eye exhibits no discharge. Left eye exhibits no discharge.  Pulmonary/Chest: Effort normal. No respiratory distress.  Abdominal: Soft. Bowel sounds are normal. He exhibits no distension and no mass. There is no tenderness. There is no guarding.  Genitourinary:  Genitourinary Comments: Chaperone present during exam. No external genital lesions or lymphadenopathy noted Penis nontender, no swelling or erythema, no discharge present.  No erythema, swelling or tenderness of the testicles or scrotum.  Neurological: He is alert. Coordination normal.  Skin: Skin is warm and dry. He is not diaphoretic.  Psychiatric: He  has a normal mood and affect. His behavior is normal.  Nursing note and vitals reviewed.    ED Treatments / Results  Labs (all labs ordered are listed, but only abnormal results are displayed) Labs Reviewed  URINALYSIS, ROUTINE W REFLEX MICROSCOPIC - Abnormal; Notable for the following components:      Result Value   Leukocytes, UA TRACE (*)    All other components within normal limits  RPR  HIV ANTIBODY (ROUTINE TESTING)  GC[facility]CHLAM[facility]DIA [facility]BE AMP [facility]    EKG None  Radiology No results found.  Procedures Procedures (including critical care time)  Medications Ordered in ED Medications  metroNIDAZOLE (FLAGYL) tablet 2,000 mg (2,000 mg Oral Given 10/03/17 2202)     Initial Impression / Assessment and Plan / ED Course  I have reviewed the triage vital signs and the nursing notes.  Pertinent labs & imaging results that were available during my care of the patient were reviewed by me and considered in my medical decision making (see chart for details).  Patient is afebrile without abdominal tenderness, abdominal pain or painful bowel movements to indicate prostatitis.  No tenderness to palpation of the testes or epididymis to suggest orchitis or epididymitis.  STD cultures obtained including HIV, syphilis, gonorrhea and chlamydia.  Urinalysis without signs of infection, no trichomoniasis noted, but given the patient has been exposed we will treat prophylactically with Flagyl.  Patient to be discharged with instructions to follow up with PCP. Discussed importance of using protection when sexually active. Pt understands that they have GC/Chlamydia cultures pending and that they will need to inform all sexual partners if results return positive.   Final Clinical Impressions(s) / ED Diagnoses   Final diagnoses:  Exposure to STD    ED Discharge Orders    None       Legrand RamsFord, Nohea Kras N, PA-C 10/03/17 2207    Tegeler, Canary Brimhristopher J, MD 10/03/17 2358

## 2017-10-03 NOTE — Discharge Instructions (Signed)
You were treated today for trichomonas.  You have STD testing pending and will be contacted in 2-3 days if any of these tests are positive.  Please use protection.  You will need to contact any partners if you test positive.  You can report to the health department for any further STD testing or treatment needs.

## 2017-10-04 LAB — GC/CHLAMYDIA PROBE AMP (~~LOC~~) NOT AT ARMC
Chlamydia: NEGATIVE
Neisseria Gonorrhea: NEGATIVE

## 2017-10-04 LAB — HIV ANTIBODY (ROUTINE TESTING W REFLEX): HIV SCREEN 4TH GENERATION: NONREACTIVE

## 2017-10-04 LAB — RPR: RPR: NONREACTIVE

## 2018-06-10 ENCOUNTER — Emergency Department (HOSPITAL_COMMUNITY)
Admission: EM | Admit: 2018-06-10 | Discharge: 2018-06-10 | Disposition: A | Discharge status: Home / Self Care | Payer: Self-pay | Attending: Emergency Medicine | Admitting: Emergency Medicine

## 2018-06-10 ENCOUNTER — Other Ambulatory Visit: Payer: Self-pay

## 2018-06-10 ENCOUNTER — Encounter (HOSPITAL_COMMUNITY): Payer: Self-pay

## 2018-06-10 DIAGNOSIS — I10 Essential (primary) hypertension: Secondary | ICD-10-CM | POA: Insufficient documentation

## 2018-06-10 DIAGNOSIS — K0889 Other specified disorders of teeth and supporting structures: Secondary | ICD-10-CM | POA: Insufficient documentation

## 2018-06-10 DIAGNOSIS — K029 Dental caries, unspecified: Secondary | ICD-10-CM | POA: Insufficient documentation

## 2018-06-10 DIAGNOSIS — F1290 Cannabis use, unspecified, uncomplicated: Secondary | ICD-10-CM | POA: Insufficient documentation

## 2018-06-10 MED ORDER — PENICILLIN V POTASSIUM 250 MG PO TABS
500.0000 mg | ORAL_TABLET | Freq: Once | ORAL | Status: AC
  Administered 2018-06-10: 500 mg via ORAL
  Filled 2018-06-10: qty 2

## 2018-06-10 MED ORDER — NAPROXEN 250 MG PO TABS
500.0000 mg | ORAL_TABLET | Freq: Once | ORAL | Status: AC
  Administered 2018-06-10: 500 mg via ORAL
  Filled 2018-06-10: qty 2

## 2018-06-10 MED ORDER — NAPROXEN 500 MG PO TABS: 500.0000 mg | ORAL_TABLET | Freq: Two times a day (BID) | ORAL | 0 refills | Status: DC | PRN

## 2018-06-10 NOTE — ED Provider Notes (Signed)
[facility] EMERGENCY DEPARTMENT Provider Note   CSN: [id number] Arrival date & time: 06/10/18  1701     History   Chief Complaint Chief Complaint  Patient presents with  . Dental Pain    HPI Austin Alvarez is a 32 y.o. male.  The history is provided by the patient and medical records. No language interpreter was used.  Dental Pain      Austin Alvarez is a 32 y.o. male who presents to the Emergency Department complaining of persistent, gradually worsening, right-sided, lower dental pain beginning yesterday. Pt describes their pain as throbbing.  No medications taken prior to arrival for symptoms.  Pain is exacerbated by cold wind/air. They are followed by dentistry and have an appointment tomorrow morning at 9 AM.  Pt denies facial swelling, fever, chills, difficulty breathing, difficulty swallowing.   Past Medical History:  Diagnosis Date  . Hypertension     There are no active problems to display for this patient.   History reviewed. No pertinent surgical history.      Home Medications    Prior to Admission medications   Medication Sig Start Date End Date Taking? Authorizing Provider  cephALEXin (KEFLEX) 500 MG capsule Take 1 capsule (500 mg total) 4 (four) times daily by mouth. 05/06/17   Muthersbaugh, Dahlia Client, PA-C  diazepam (VALIUM) 5 MG tablet Take 1 tablet (5 mg total) by mouth every 8 (eight) hours as needed for anxiety or muscle spasms. 10/07/14   Kirichenko, Lemont Fillers, PA-C  hydrOXYzine (ATARAX/VISTARIL) 25 MG tablet Take 1 tablet (25 mg total) by mouth every 6 (six) hours. 08/01/13   Arthor Captain, PA-C  ibuprofen (ADVIL,MOTRIN) 800 MG tablet Take 1 tablet (800 mg total) by mouth every 8 (eight) hours as needed for mild pain. 02/18/15   , Layla Maw, DO  naproxen (NAPROSYN) 500 MG tablet Take 1 tablet (500 mg total) by mouth 2 (two) times daily as needed for mild pain or moderate pain. 06/10/18   , Chase Picket, PA-C  penicillin v  potassium (VEETID) 500 MG tablet Take 1 tablet (500 mg total) by mouth 4 (four) times daily. 03/12/15   Emilia Beck, PA-C  permethrin (ELIMITE) 5 % cream Apply 1 application topically once. 08/01/13   Arthor Captain, PA-C  traMADol (ULTRAM) 50 MG tablet Take 1 tablet (50 mg total) by mouth every 6 (six) hours as needed. 02/18/15   , Layla Maw, DO    Family History Family History  Problem Relation Age of Onset  . Hypertension Other     Social History Social History   Tobacco Use  . Smoking status: Never Smoker  . Smokeless tobacco: Never Used  Substance Use Topics  . Alcohol use: No  . Drug use: Yes    Types: Marijuana    Comment: occ      Allergies   Patient has no known allergies.   Review of Systems Review of Systems  Constitutional: Negative for chills and fever.  HENT: Positive for dental problem. Negative for congestion, sore throat and trouble swallowing.   Respiratory: Negative for cough and shortness of breath.      Physical Exam Updated Vital Signs BP (!) 153/113 (BP Location: Left Arm)   Pulse (!) 57   Temp 98.5 F (36.9 C) (Oral)   Resp 14   SpO2 99%   Physical Exam Vitals signs and nursing note reviewed.  Constitutional:      General: He is not in acute distress.    Appearance:  He is well-developed.  HENT:     Head: Normocephalic and atraumatic.     Mouth/Throat:      Comments: Dental cavities and poor oral dentition noted. Pain along tooth as depicted in image. No abscess noted. Midline uvula. No trismus. OP moist and clear. No oropharyngeal erythema or edema. Neck supple with no tenderness. No facial edema. Neck:     Musculoskeletal: Neck supple.  Cardiovascular:     Rate and Rhythm: Normal rate and regular rhythm.     Heart sounds: Normal heart sounds. No murmur.  Pulmonary:     Effort: Pulmonary effort is normal. No respiratory distress.     Breath sounds: Normal breath sounds. No wheezing or rales.  Musculoskeletal: Normal range  of motion.  Skin:    General: Skin is warm and dry.  Neurological:     Mental Status: He is alert.      ED Treatments / Results  Labs (all labs ordered are listed, but only abnormal results are displayed) Labs Reviewed - No data to display  EKG None  Radiology No results found.  Procedures Procedures (including critical care time)  Medications Ordered in ED Medications  penicillin v potassium (VEETID) tablet 500 mg (500 mg Oral Given 06/10/18 1747)  naproxen (NAPROSYN) tablet 500 mg (500 mg Oral Given 06/10/18 1748)     Initial Impression / Assessment and Plan / ED Course  I have reviewed the triage vital signs and the nursing notes.  Pertinent labs & imaging results that were available during my care of the patient were reviewed by me and considered in my medical decision making (see chart for details).    Austin Alvarez is a 32 y.o. male who presents to ED for dental pain. No abscess requiring immediate incision and drainage. Patient is afebrile, non toxic appearing, and swallowing secretions well. Exam not concerning for Ludwig's angina or pharyngeal abscess.  The tooth and gums do not look infected to me.  He has an appointment with his dentist tomorrow morning at 9 AM.  Will give 1 dose of Pen-Vee K in the emergency department tonight and then have him ask dentist tomorrow if they would like to continue antibiotic course.  Stressed the importance of dental follow-up. Patient voices understanding and is agreeable to plan.   Final Clinical Impressions(s) / ED Diagnoses   Final diagnoses:  Pain, dental    ED Discharge Orders         Ordered    naproxen (NAPROSYN) 500 MG tablet  2 times daily PRN     06/10/18 1750           , Chase PicketJaime Pilcher, PA-C 06/10/18 1755    Gerhard MunchLockwood, Robert, MD 06/10/18 [phone]

## 2018-06-10 NOTE — ED Notes (Signed)
Patient verbalizes understanding of discharge instructions. Opportunity for questioning and answers were provided. Armband removed by staff, pt discharged from ED ambulatory.   

## 2018-06-10 NOTE — ED Triage Notes (Signed)
Pt reports dental cavity on right lower molar, has an appointment to get it pulled tomorrow but cannot wait due to pain.

## 2018-06-10 NOTE — Discharge Instructions (Signed)
It was my pleasure taking care of you today!   Please keep your appointment with the dentist tomorrow morning. Let them know that we gave you one dose of antibiotics tonight (Penicillin V Potassium). As your next dose would be in the morning, ask them if you should continue with antibiotics and if so, they can write the prescription.   Naproxen as needed for pain.   Return to ER for new or worsening symptoms, any additional concerns.

## 2018-06-19 ENCOUNTER — Other Ambulatory Visit: Payer: Self-pay

## 2018-06-19 ENCOUNTER — Encounter (HOSPITAL_COMMUNITY): Payer: Self-pay | Admitting: *Deleted

## 2018-06-19 ENCOUNTER — Emergency Department (HOSPITAL_COMMUNITY)
Admission: EM | Admit: 2018-06-19 | Discharge: 2018-06-19 | Disposition: A | Discharge status: Home / Self Care | Payer: Self-pay | Attending: Emergency Medicine | Admitting: Emergency Medicine

## 2018-06-19 DIAGNOSIS — M7918 Myalgia, other site: Secondary | ICD-10-CM | POA: Insufficient documentation

## 2018-06-19 DIAGNOSIS — B9789 Other viral agents as the cause of diseases classified elsewhere: Secondary | ICD-10-CM

## 2018-06-19 DIAGNOSIS — R05 Cough: Secondary | ICD-10-CM | POA: Insufficient documentation

## 2018-06-19 DIAGNOSIS — Z79899 Other long term (current) drug therapy: Secondary | ICD-10-CM | POA: Insufficient documentation

## 2018-06-19 DIAGNOSIS — K047 Periapical abscess without sinus: Secondary | ICD-10-CM | POA: Insufficient documentation

## 2018-06-19 DIAGNOSIS — I10 Essential (primary) hypertension: Secondary | ICD-10-CM | POA: Insufficient documentation

## 2018-06-19 DIAGNOSIS — J069 Acute upper respiratory infection, unspecified: Secondary | ICD-10-CM | POA: Insufficient documentation

## 2018-06-19 MED ORDER — CLINDAMYCIN HCL 150 MG PO CAPS: 450.0000 mg | ORAL_CAPSULE | Freq: Three times a day (TID) | ORAL | 0 refills | Status: AC

## 2018-06-19 NOTE — ED Provider Notes (Signed)
[facility] EMERGENCY [facility]MENT Provider Note   CSN: [id number] Arrival date & time: 06/19/18  1520     History   Chief Complaint Chief Complaint  Patient presents with  . Back Pain  . Generalized Body Aches    HPI Austin Alvarez is a 32 y.o. male.  32 yo M with a chief complaint of subjective fevers myalgias and cough.  Going on for the past week.  No known sick contacts.  Denies recent travel.  Happens usually at night.  He had seen a dentist early last weekend they were unable to pull his tooth because it was acutely infected.  They prescribed him antibiotics but he was unable to get them filled.  He feels that that area has gotten slightly more swollen.  The history is provided by the patient.  Back Pain   Associated symptoms include a fever. Pertinent negatives include no chest pain, no headaches and no abdominal pain.  Illness  This is a new problem. The current episode started more than 2 days ago. The problem occurs constantly. The problem has been gradually worsening. Pertinent negatives include no chest pain, no abdominal pain, no headaches and no shortness of breath. Nothing aggravates the symptoms. Nothing relieves the symptoms. He has tried nothing for the symptoms. The treatment provided no relief.    Past Medical History:  Diagnosis Date  . Hypertension     There are no active problems to display for this patient.   History reviewed. No pertinent surgical history.      Home Medications    Prior to Admission medications   Medication Sig Start Date End Date Taking? Authorizing Provider  cephALEXin (KEFLEX) 500 MG capsule Take 1 capsule (500 mg total) 4 (four) times daily by mouth. 05/06/17   Muthersbaugh, Dahlia ClientHannah, PA-C  clindamycin (CLEOCIN) 150 MG capsule Take 3 capsules (450 mg total) by mouth 3 (three) times daily for 7 days. 06/19/18 06/26/18  Melene PlanFloyd, Cynda Soule, DO  diazepam (VALIUM) 5 MG tablet Take 1 tablet (5 mg total) by mouth every 8  (eight) hours as needed for anxiety or muscle spasms. 10/07/14   Kirichenko, Lemont Fillersatyana, PA-C  hydrOXYzine (ATARAX/VISTARIL) 25 MG tablet Take 1 tablet (25 mg total) by mouth every 6 (six) hours. 08/01/13   Arthor CaptainHarris, Abigail, PA-C  ibuprofen (ADVIL,MOTRIN) 800 MG tablet Take 1 tablet (800 mg total) by mouth every 8 (eight) hours as needed for mild pain. 02/18/15   Ward, Layla MawKristen N, DO  naproxen (NAPROSYN) 500 MG tablet Take 1 tablet (500 mg total) by mouth 2 (two) times daily as needed for mild pain or moderate pain. 06/10/18   Ward, Chase PicketJaime Pilcher, PA-C  penicillin v potassium (VEETID) 500 MG tablet Take 1 tablet (500 mg total) by mouth 4 (four) times daily. 03/12/15   Emilia BeckSzekalski, Kaitlyn, PA-C  permethrin (ELIMITE) 5 % cream Apply 1 application topically once. 08/01/13   Arthor CaptainHarris, Abigail, PA-C  traMADol (ULTRAM) 50 MG tablet Take 1 tablet (50 mg total) by mouth every 6 (six) hours as needed. 02/18/15   Ward, Layla MawKristen N, DO    Family History Family History  Problem Relation Age of Onset  . Hypertension Other     Social History Social History   Tobacco Use  . Smoking status: Never Smoker  . Smokeless tobacco: Never Used  Substance Use Topics  . Alcohol use: No  . Drug use: Yes    Types: Marijuana    Comment: occ      Allergies  Patient has no known allergies.   Review of Systems Review of Systems  Constitutional: Positive for chills and fever.  HENT: Positive for dental problem. Negative for congestion and facial swelling.   Eyes: Negative for discharge and visual disturbance.  Respiratory: Negative for shortness of breath.   Cardiovascular: Negative for chest pain and palpitations.  Gastrointestinal: Negative for abdominal pain, diarrhea and vomiting.  Musculoskeletal: Positive for back pain and myalgias. Negative for arthralgias.  Skin: Negative for color change and rash.  Neurological: Negative for tremors, syncope and headaches.  Psychiatric/Behavioral: Negative for confusion and  dysphoric mood.     Physical Exam Updated Vital Signs BP (!) 118/100 (BP Location: Right Arm)   Pulse 86   Temp 98.6 F (37 C) (Oral)   Resp 18   SpO2 100%   Physical Exam Vitals signs and nursing note reviewed.  Constitutional:      Appearance: He is well-developed.  HENT:     Head: Normocephalic and atraumatic.     Comments: Swollen turbinates, posterior nasal drip, no noted sinus ttp, tm normal bilaterally.   The patient does have a large fluctuant area lateral of his first molar. Eyes:     Pupils: Pupils are equal, round, and reactive to light.  Neck:     Musculoskeletal: Normal range of motion and neck supple.     Vascular: No JVD.  Cardiovascular:     Rate and Rhythm: Normal rate and regular rhythm.     Heart sounds: No murmur. No friction rub. No gallop.   Pulmonary:     Effort: No respiratory distress.     Breath sounds: No wheezing.  Abdominal:     General: There is no distension.     Tenderness: There is no guarding or rebound.  Musculoskeletal: Normal range of motion.  Skin:    Coloration: Skin is not pale.     Findings: No rash.  Neurological:     Mental Status: He is alert and oriented to person, place, and time.  Psychiatric:        Behavior: Behavior normal.      ED Treatments / Results  Labs (all labs ordered are listed, but only abnormal results are displayed) Labs Reviewed - No data to display  EKG None  Radiology No results found.  Procedures .Marland KitchenIncision and Drainage Date/Time: 06/19/2018 4:14 PM Performed by: Melene Plan, DO Authorized by: Melene Plan, DO   Consent:    Consent obtained:  Verbal   Consent given by:  Patient   Risks discussed:  Bleeding, incomplete drainage and infection   Alternatives discussed:  No treatment and delayed treatment Location:    Type:  Abscess   Location:  Mouth   Mouth location:  Masticator space Anesthesia (see MAR for exact dosages):    Anesthesia method:  None Procedure type:     Complexity:  Complex Procedure details:    Needle aspiration: no     Incision type: finger expressed.   Incision depth:  Submucosal   Wound management:  Probed and deloculated   Drainage:  Purulent   Drainage amount:  Copious   Wound treatment:  Wound left open   Packing materials:  None Post-procedure details:    Patient tolerance of procedure:  Tolerated well, no immediate complications   (including critical care time)  Medications Ordered in ED Medications - No data to display   Initial Impression / Assessment and Plan / ED Course  I have reviewed the triage vital signs and the nursing  notes.  Pertinent labs & imaging results that were available during my care of the patient were reviewed by me and considered in my medical decision making (see chart for details).     32 yo M with a chief complaint of cough congestion fever chills myalgias and dental pain.  Most likely the patient has a viral illness on top of his dental pain though clinically he does have a very large dental abscess which was I&D at bedside.  We will start the patient on antibiotics and have him follow-up with his family doctor and dentist.   4:15 PM:  I have discussed the diagnosis/risks/treatment options with the patient and believe the pt to be eligible for discharge home to follow-up with PCP, dentist. We also discussed returning to the ED immediately if new or worsening sx occur. We discussed the sx which are most concerning (e.g., sudden worsening pain, fever, inability to tolerate by mouth) that necessitate immediate return. Medications administered to the patient during their visit and any new prescriptions provided to the patient are listed below.  Medications given during this visit Medications - No data to display    The patient appears reasonably screen and/or stabilized for discharge and I doubt any other medical condition or other [facility] requiring further screening, evaluation, or treatment in the ED  at this time prior to discharge.    Final Clinical Impressions(s) / ED Diagnoses   Final diagnoses:  Dental abscess  Viral URI with cough    ED Discharge Orders         Ordered    clindamycin (CLEOCIN) 150 MG capsule  3 times daily     06/19/18 1612           Melene PlanFloyd, Mathews Stuhr, DO 06/19/18 1615

## 2018-06-19 NOTE — Discharge Instructions (Addendum)
Take 4 over the counter ibuprofen tablets 3 times a day or 2 over-the-counter naproxen tablets twice a day for pain. Also take tylenol 1000mg(2 extra strength) four times a day.    

## 2018-06-19 NOTE — ED Triage Notes (Addendum)
PT is here with dental pain to right lower mouth and has been feeling sick since. Fever and body aches.  Pt states he has lower back pain with movement and denies pain or burning with urination.  Mask placed on patient

## 2018-09-08 ENCOUNTER — Emergency Department (HOSPITAL_COMMUNITY)
Admission: EM | Admit: 2018-09-08 | Discharge: 2018-09-08 | Disposition: A | Discharge status: Home / Self Care | Payer: Self-pay | Attending: Emergency Medicine | Admitting: Emergency Medicine

## 2018-09-08 ENCOUNTER — Encounter (HOSPITAL_COMMUNITY): Payer: Self-pay

## 2018-09-08 ENCOUNTER — Other Ambulatory Visit: Payer: Self-pay

## 2018-09-08 DIAGNOSIS — Z79899 Other long term (current) drug therapy: Secondary | ICD-10-CM | POA: Insufficient documentation

## 2018-09-08 DIAGNOSIS — K047 Periapical abscess without sinus: Secondary | ICD-10-CM | POA: Insufficient documentation

## 2018-09-08 DIAGNOSIS — I10 Essential (primary) hypertension: Secondary | ICD-10-CM | POA: Insufficient documentation

## 2018-09-08 MED ORDER — LIDOCAINE-EPINEPHRINE (PF) 2 %-1:200000 IJ SOLN
10.0000 mL | Freq: Once | INTRAMUSCULAR | Status: DC
  Filled 2018-09-08: qty 20

## 2018-09-08 MED ORDER — PENICILLIN V POTASSIUM 500 MG PO TABS: 500.0000 mg | ORAL_TABLET | Freq: Three times a day (TID) | ORAL | 0 refills | Status: AC

## 2018-09-08 NOTE — ED Triage Notes (Signed)
Pt states noted right mandible swelling where tooth is that has a hole yesterday suddenly while brushing teeth. States this happened before and had to have it lanced. Denies fever or chills, and otherwise "feels fine". Took tylenol this am at 9:00

## 2018-09-08 NOTE — ED Provider Notes (Signed)
[facility] EMERGENCY DEPARTMENT Provider Note   CSN: [id number] Arrival date & time: 09/08/18  1520    History   Chief Complaint Chief Complaint  Patient presents with  . Oral Swelling    HPI Austin Alvarez is a 33 y.o. male.     HPI 34 yo male with ho dental abscess presents today with increased swelling for several days and pain in around his right lower first molar.  This is the same area he previously had an abscess at in December.  He denies any fever or chills.  He has a history of hypertension.  He does have a dentist but states that she did not want to treat it last time that he was just coming to the ED this time. Past Medical History:  Diagnosis Date  . Hypertension     There are no active problems to display for this patient.   History reviewed. No pertinent surgical history.      Home Medications    Prior to Admission medications   Medication Sig Start Date End Date Taking? Authorizing Provider  cephALEXin (KEFLEX) 500 MG capsule Take 1 capsule (500 mg total) 4 (four) times daily by mouth. 05/06/17   Muthersbaugh, Dahlia Client, PA-C  diazepam (VALIUM) 5 MG tablet Take 1 tablet (5 mg total) by mouth every 8 (eight) hours as needed for anxiety or muscle spasms. 10/07/14   Kirichenko, Lemont Fillers, PA-C  hydrOXYzine (ATARAX/VISTARIL) 25 MG tablet Take 1 tablet (25 mg total) by mouth every 6 (six) hours. 08/01/13   Arthor Captain, PA-C  ibuprofen (ADVIL,MOTRIN) 800 MG tablet Take 1 tablet (800 mg total) by mouth every 8 (eight) hours as needed for mild pain. 02/18/15   Ward, Layla Maw, DO  naproxen (NAPROSYN) 500 MG tablet Take 1 tablet (500 mg total) by mouth 2 (two) times daily as needed for mild pain or moderate pain. 06/10/18   Ward, Chase Picket, PA-C  penicillin v potassium (VEETID) 500 MG tablet Take 1 tablet (500 mg total) by mouth 4 (four) times daily. 03/12/15   Emilia Beck, PA-C  permethrin (ELIMITE) 5 % cream Apply 1 application topically  once. 08/01/13   Arthor Captain, PA-C  traMADol (ULTRAM) 50 MG tablet Take 1 tablet (50 mg total) by mouth every 6 (six) hours as needed. 02/18/15   Ward, Layla Maw, DO    Family History Family History  Problem Relation Age of Onset  . Hypertension Other     Social History Social History   Tobacco Use  . Smoking status: Never Smoker  . Smokeless tobacco: Never Used  Substance Use Topics  . Alcohol use: No  . Drug use: Yes    Types: Marijuana    Comment: occ      Allergies   Patient has no known allergies.   Review of Systems Review of Systems  All other systems reviewed and are negative.    Physical Exam Updated Vital Signs BP (!) 124/93 (BP Location: Right Arm)   Pulse 76   Temp 98.4 F (36.9 C) (Oral)   Resp 18   Ht 1.727 m (5\' 8" )   Wt 74.8 kg   SpO2 100%   BMI 25.07 kg/m   Physical Exam Vitals signs and nursing note reviewed.  HENT:     Head: Normocephalic and atraumatic.     Right Ear: External ear normal.     Left Ear: External ear normal.     Mouth/Throat:     Comments: Right lower first  molar with K and swelling and gum below this.  The swelling does not extend into the submandibular space patient has normal airway and is speaking clearly Eyes:     Pupils: Pupils are equal, round, and reactive to light.  Neck:     Musculoskeletal: Normal range of motion.  Cardiovascular:     Rate and Rhythm: Normal rate.  Neurological:     General: No focal deficit present.     Mental Status: He is alert.      ED Treatments / Results  Labs (all labs ordered are listed, but only abnormal results are displayed) Labs Reviewed - No data to display  EKG None  Radiology No results found.  Procedures .Marland KitchenIncision and Drainage Date/Time: 09/08/2018 5:27 PM Performed by: Margarita Grizzle, MD Authorized by: Margarita Grizzle, MD   Consent:    Consent obtained:  Verbal   Consent given by:  Patient   Risks discussed:  Incomplete drainage   Alternatives  discussed:  No treatment Location:    Type:  Abscess   Location:  Mouth   Mouth location:  Alveolar process Anesthesia (see MAR for exact dosages):    Anesthesia method:  Local infiltration   Local anesthetic:  Lidocaine 1% WITH epi Procedure type:    Complexity:  Simple Procedure details:    Needle aspiration: no     Incision types:  Single straight   Incision depth:  Submucosal   Scalpel blade:  11   Wound management:  Probed and deloculated   Drainage:  Purulent   Drainage amount:  Moderate   Wound treatment:  Wound left open   Packing materials:  None Post-procedure details:    Patient tolerance of procedure:  Tolerated well, no immediate complications   (including critical care time)  Medications Ordered in ED Medications  lidocaine-EPINEPHrine (XYLOCAINE W/EPI) 2 %-1:200000 (PF) injection 10 mL (has no administration in time range)     Initial Impression / Assessment and Plan / ED Course  I have reviewed the triage vital signs and the nursing notes.  Pertinent labs & imaging results that were available during my care of the patient were reviewed by me and considered in my medical decision making (see chart for details).        Patient with dental abscess drained here in ED.  Plan penicillin and follow-up with primary care doctor.  Patient advised regarding return precautions and need for follow-up voices understanding.  Final Clinical Impressions(s) / ED Diagnoses   Final diagnoses:  Dental abscess    ED Discharge Orders    None       Margarita Grizzle, MD 09/08/18 1729

## 2018-11-02 ENCOUNTER — Other Ambulatory Visit: Payer: Self-pay

## 2018-11-02 ENCOUNTER — Encounter (HOSPITAL_COMMUNITY): Payer: Self-pay | Admitting: Emergency Medicine

## 2018-11-02 ENCOUNTER — Emergency Department (HOSPITAL_COMMUNITY)
Admission: EM | Admit: 2018-11-02 | Discharge: 2018-11-02 | Disposition: A | Discharge status: Home / Self Care | Payer: Self-pay | Attending: Emergency Medicine | Admitting: Emergency Medicine

## 2018-11-02 DIAGNOSIS — Z79899 Other long term (current) drug therapy: Secondary | ICD-10-CM | POA: Insufficient documentation

## 2018-11-02 DIAGNOSIS — I10 Essential (primary) hypertension: Secondary | ICD-10-CM | POA: Insufficient documentation

## 2018-11-02 DIAGNOSIS — N342 Other urethritis: Secondary | ICD-10-CM | POA: Insufficient documentation

## 2018-11-02 LAB — RPR: RPR Ser Ql: NONREACTIVE

## 2018-11-02 LAB — URINALYSIS, ROUTINE W REFLEX MICROSCOPIC
Bacteria, UA: NONE SEEN
Bilirubin Urine: NEGATIVE
Glucose, UA: NEGATIVE mg/dL
Hgb urine dipstick: NEGATIVE
Ketones, ur: NEGATIVE mg/dL
Nitrite: NEGATIVE
Protein, ur: NEGATIVE mg/dL
Specific Gravity, Urine: 1.029 (ref 1.005–1.030)
WBC, UA: >50 WBC/hpf — ABNORMAL HIGH (ref 0–5)
pH: 6 (ref 5.0–8.0)

## 2018-11-02 LAB — HIV ANTIBODY (ROUTINE TESTING W REFLEX): HIV Screen 4th Generation wRfx: NONREACTIVE

## 2018-11-02 MED ORDER — AZITHROMYCIN 250 MG PO TABS
1000.0000 mg | ORAL_TABLET | Freq: Once | ORAL | Status: AC
  Administered 2018-11-02: 1000 mg via ORAL
  Filled 2018-11-02: qty 4

## 2018-11-02 MED ORDER — CEFTRIAXONE SODIUM 250 MG IJ SOLR
250.0000 mg | Freq: Once | INTRAMUSCULAR | Status: AC
  Administered 2018-11-02: 250 mg via INTRAMUSCULAR
  Filled 2018-11-02: qty 250

## 2018-11-02 MED ORDER — LIDOCAINE HCL (PF) 1 % IJ SOLN
INTRAMUSCULAR | Status: AC
  Filled 2018-11-02: qty 5

## 2018-11-02 NOTE — ED Provider Notes (Signed)
[facility] EMERGENCY DEPARTMENT Provider Note   CSN: [id number] Arrival date & time: 11/02/18  0114    History   Chief Complaint Chief Complaint  Patient presents with  . Exposure to STD    HPI Austin Alvarez is a 33 y.o. male.     Patient presents with complaint of dysuria.  Patient states that he recently had unprotected sexual intercourse with a male partner and afterwards developed burning with urination.  He denies any drainage from the penis.  He denies any external sores or rashes.  No sore throat.  No fevers.  He denies other complaints.  He does not know if his partner has an STI.  He is here requesting testing and treatment.     Past Medical History:  Diagnosis Date  . Hypertension     There are no active problems to display for this patient.   History reviewed. No pertinent surgical history.      Home Medications    Prior to Admission medications   Medication Sig Start Date End Date Taking? Authorizing Provider  cephALEXin (KEFLEX) 500 MG capsule Take 1 capsule (500 mg total) 4 (four) times daily by mouth. 05/06/17   Muthersbaugh, Dahlia Client, PA-C  diazepam (VALIUM) 5 MG tablet Take 1 tablet (5 mg total) by mouth every 8 (eight) hours as needed for anxiety or muscle spasms. 10/07/14   Kirichenko, Lemont Fillers, PA-C  hydrOXYzine (ATARAX/VISTARIL) 25 MG tablet Take 1 tablet (25 mg total) by mouth every 6 (six) hours. 08/01/13   Arthor Captain, PA-C  ibuprofen (ADVIL,MOTRIN) 800 MG tablet Take 1 tablet (800 mg total) by mouth every 8 (eight) hours as needed for mild pain. 02/18/15   Ward, Layla Maw, DO  naproxen (NAPROSYN) 500 MG tablet Take 1 tablet (500 mg total) by mouth 2 (two) times daily as needed for mild pain or moderate pain. 06/10/18   Ward, Chase Picket, PA-C  penicillin v potassium (VEETID) 500 MG tablet Take 1 tablet (500 mg total) by mouth 3 (three) times daily. 09/08/18   Margarita Grizzle, MD  permethrin (ELIMITE) 5 % cream Apply 1  application topically once. 08/01/13   Arthor Captain, PA-C  traMADol (ULTRAM) 50 MG tablet Take 1 tablet (50 mg total) by mouth every 6 (six) hours as needed. 02/18/15   Ward, Layla Maw, DO    Family History Family History  Problem Relation Age of Onset  . Hypertension Other     Social History Social History   Tobacco Use  . Smoking status: Never Smoker  . Smokeless tobacco: Never Used  Substance Use Topics  . Alcohol use: No  . Drug use: Yes    Types: Marijuana    Comment: occ      Allergies   Patient has no known allergies.   Review of Systems Review of Systems  Constitutional: Negative for fever.  HENT: Negative for sore throat.   Eyes: Negative for discharge.  Gastrointestinal: Negative for rectal pain.  Genitourinary: Positive for dysuria. Negative for discharge, frequency, genital sores, penile pain and testicular pain.  Musculoskeletal: Negative for arthralgias.  Skin: Negative for rash.  Hematological: Negative for adenopathy.     Physical Exam Updated Vital Signs BP (!) 138/99 (BP Location: Right Arm)   Pulse 76   Temp 98.2 F (36.8 C) (Oral)   Ht 5\' 8"  (1.727 m)   Wt 74.8 kg   SpO2 100%   BMI 25.09 kg/m   Physical Exam Vitals signs and nursing note reviewed.  Constitutional:      Appearance: He is well-developed.  HENT:     Head: Normocephalic and atraumatic.  Eyes:     Conjunctiva/sclera: Conjunctivae normal.  Neck:     Musculoskeletal: Normal range of motion and neck supple.  Pulmonary:     Effort: No respiratory distress.  Skin:    General: Skin is warm and dry.  Neurological:     Mental Status: He is alert.      ED Treatments / Results  Labs (all labs ordered are listed, but only abnormal results are displayed) Labs Reviewed  URINALYSIS, ROUTINE W REFLEX MICROSCOPIC - Abnormal; Notable for the following components:      Result Value   APPearance HAZY (*)    Leukocytes,Ua LARGE (*)    WBC, UA >50 (*)    All other components  within normal limits  RPR  HIV ANTIBODY (ROUTINE TESTING W REFLEX)  GC/CHLAMYDIA PROBE AMP ([place]) NOT AT [facility]    EKG None  Radiology No results found.  Procedures Procedures (including critical care time)  Medications Ordered in ED Medications  lidocaine (PF) (XYLOCAINE) 1 % injection (has no administration in time range)  cefTRIAXone (ROCEPHIN) injection 250 mg (250 mg Intramuscular Given 11/02/18 0223)  azithromycin (ZITHROMAX) tablet 1,000 mg (1,000 mg Oral Given 11/02/18 0222)     Initial Impression / Assessment and Plan / ED Course  I have reviewed the triage vital signs and the nursing notes.  Pertinent labs & imaging results that were available during my care of the patient were reviewed by me and considered in my medical decision making (see chart for details).        Patient seen and examined. Will test and treat for STI. Pt agreeable to testing for HIV and syphilis.   Vital signs reviewed and are as follows: BP (!) 138/99 (BP Location: Right Arm)   Pulse 76   Temp 98.2 F (36.8 C) (Oral)   Ht 5\' 8"  (1.727 m)   Wt 74.8 kg   SpO2 100%   BMI 25.09 kg/m   Patient counseled on safe sexual practices. Told them that they should not have sexual contact for next 7 days and that they need to inform sexual partners so that they can get tested and treated as well. Patient verbalizes understanding and agrees with plan.     Final Clinical Impressions(s) / ED Diagnoses   Final diagnoses:  Urethritis   Patient with suspected urethritis.  He has been treated for gonorrhea and chlamydia tonight.  He has been tested for gonorrhea, chlamydia, syphilis, HIV.  Testing pending.  Patient counseled as above.  ED Discharge Orders    None       Renne CriglerGeiple, Zelie Asbill, PA-C 11/02/18 0300    Zadie RhineWickline, Donald, MD 11/02/18 [phone]

## 2018-11-02 NOTE — Discharge Instructions (Signed)
Please read and follow all provided instructions.  Your diagnoses today include:  1. Urethritis     Tests performed today include:  Test for gonorrhea and chlamydia.   Test for HIV and syphilis.   You will be notified by telephone with any positive results.   Vital signs. See below for your results today.   Medications:  You were treated with azithromycin and rocephin today. These antibiotics treat you for gonorrhea and chlamydia. They do not treat for HIV or syphilis.   Home care instructions:  Read educational materials contained in this packet and follow any instructions provided.   You should tell your partners about your infection and avoid having sex for one week to allow time for the medicine to work.  Sexually transmitted disease testing also available at:   [facility]  [address], [place], phone [phone] or [phone]    Monday - Friday, call for an appointment  [facility] Department of [facility], [facility]  [address], [place], phone [phone] or [phone]   Monday - Friday, call for an appointment  Return instructions:   Please return to the Emergency Department if you experience worsening symptoms.   Please return if you have any other emergent concerns.  Additional Information:  Your vital signs today were: BP (!) 138/99 (BP Location: Right Arm)    Pulse 76    Temp 98.2 F (36.8 C) (Oral)    Ht 5\' 8"  (1.727 m)    Wt 74.8 kg    SpO2 100%    BMI 25.09 kg/m  If your blood pressure (BP) was elevated above 135/85 this visit, please have this repeated by your doctor within one month. --------------

## 2018-11-02 NOTE — ED Notes (Signed)
Unable to obtain e sign d/t equipment malfunction.  D/w pt d/c instructions, follow up, return precautions as well as educating pt about HTN.  Pt verbalized understanding of all of the above.

## 2018-11-02 NOTE — ED Triage Notes (Signed)
Pt reports he was exposed to STD and now it burns to urinate.  No other complaints.

## 2019-05-07 ENCOUNTER — Emergency Department (HOSPITAL_COMMUNITY)
Admission: EM | Admit: 2019-05-07 | Discharge: 2019-05-07 | Disposition: A | Discharge status: Home / Self Care | Payer: Self-pay | Attending: Emergency Medicine | Admitting: Emergency Medicine

## 2019-05-07 ENCOUNTER — Other Ambulatory Visit: Payer: Self-pay

## 2019-05-07 ENCOUNTER — Encounter (HOSPITAL_COMMUNITY): Payer: Self-pay

## 2019-05-07 DIAGNOSIS — I10 Essential (primary) hypertension: Secondary | ICD-10-CM | POA: Insufficient documentation

## 2019-05-07 DIAGNOSIS — Z202 Contact with and (suspected) exposure to infections with a predominantly sexual mode of transmission: Secondary | ICD-10-CM | POA: Insufficient documentation

## 2019-05-07 LAB — URINALYSIS, ROUTINE W REFLEX MICROSCOPIC
Bilirubin Urine: NEGATIVE
Glucose, UA: NEGATIVE mg/dL
Ketones, ur: 20 mg/dL — AB
Nitrite: NEGATIVE
Protein, ur: 100 mg/dL — AB
Specific Gravity, Urine: 1.031 — ABNORMAL HIGH (ref 1.005–1.030)
WBC, UA: >50 WBC/hpf — ABNORMAL HIGH (ref 0–5)
pH: 5 (ref 5.0–8.0)

## 2019-05-07 LAB — HIV ANTIBODY (ROUTINE TESTING W REFLEX): HIV Screen 4th Generation wRfx: NONREACTIVE

## 2019-05-07 MED ORDER — AZITHROMYCIN 250 MG PO TABS
1000.0000 mg | ORAL_TABLET | Freq: Once | ORAL | Status: AC
  Administered 2019-05-07: 16:00:00 1000 mg via ORAL
  Filled 2019-05-07: qty 4

## 2019-05-07 MED ORDER — CEFTRIAXONE SODIUM 250 MG IJ SOLR
250.0000 mg | Freq: Once | INTRAMUSCULAR | Status: AC
  Administered 2019-05-07: 16:00:00 250 mg via INTRAMUSCULAR
  Filled 2019-05-07: qty 250

## 2019-05-07 MED ORDER — STERILE WATER FOR INJECTION IJ SOLN
INTRAMUSCULAR | Status: AC
  Administered 2019-05-07: 16:00:00 10 mL
  Filled 2019-05-07: qty 10

## 2019-05-07 NOTE — Discharge Instructions (Signed)
Will be called in 3 to 4 days for your STD testing if anything is positive.  If your gonorrhea or chlamydia testing is positive you have ready been treated for this.  If your HIV or syphilis testing is positive you will need to follow-up with the [place].

## 2019-05-07 NOTE — ED Triage Notes (Signed)
Pt states he received a text from a girl he was messing with today to go get checked. Pt is unsure if the girl actually has an STD or not but wants to be checked.

## 2019-05-07 NOTE — ED Provider Notes (Signed)
[facility] EMERGENCY DEPARTMENT Provider Note   CSN: [id number] Arrival date & time: 05/07/19  1416     History   Chief Complaint Chief Complaint  Patient presents with  . Exposure to STD    HPI Austin Alvarez is a 33 y.o. male with history significant for hypertension who presents for evaluation of exposure to STD.  Patient states his partner told him she tested positive for either gonorrhea or chlamydia.  Patient states he has had some mild burning with urination over the last 24 hours however denies fever, chills, nausea, vomiting, chest pain, shortness of breath, abdominal pain, diarrhea, penile discharge, rashes or lesions, pain with bowel movements, swelling, redness or warmth to his testicles, inguinal lymphadenopathy.  Not take anything for symptoms.  He has been sexually active with this one partner, male and did not use protection.  Denies additional aggravating or alleviating factors.  History obtained from patient and past medical history. No interpreter was used.     HPI  Past Medical History:  Diagnosis Date  . Hypertension     There are no active problems to display for this patient.   History reviewed. No pertinent surgical history.      Home Medications    Prior to Admission medications   Medication Sig Start Date End Date Taking? Authorizing Provider  cephALEXin (KEFLEX) 500 MG capsule Take 1 capsule (500 mg total) 4 (four) times daily by mouth. 05/06/17   Muthersbaugh, Jarrett Soho, PA-C  diazepam (VALIUM) 5 MG tablet Take 1 tablet (5 mg total) by mouth every 8 (eight) hours as needed for anxiety or muscle spasms. 10/07/14   Kirichenko, Lahoma Rocker, PA-C  hydrOXYzine (ATARAX/VISTARIL) 25 MG tablet Take 1 tablet (25 mg total) by mouth every 6 (six) hours. 08/01/13   Margarita Mail, PA-C  ibuprofen (ADVIL,MOTRIN) 800 MG tablet Take 1 tablet (800 mg total) by mouth every 8 (eight) hours as needed for mild pain. 02/18/15   Ward, Delice Bison, DO   naproxen (NAPROSYN) 500 MG tablet Take 1 tablet (500 mg total) by mouth 2 (two) times daily as needed for mild pain or moderate pain. 06/10/18   Ward, Ozella Almond, PA-C  penicillin v potassium (VEETID) 500 MG tablet Take 1 tablet (500 mg total) by mouth 3 (three) times daily. 09/08/18   Pattricia Boss, MD  permethrin (ELIMITE) 5 % cream Apply 1 application topically once. 08/01/13   Margarita Mail, PA-C  traMADol (ULTRAM) 50 MG tablet Take 1 tablet (50 mg total) by mouth every 6 (six) hours as needed. 02/18/15   Ward, Delice Bison, DO    Family History Family History  Problem Relation Age of Onset  . Hypertension Other     Social History Social History   Tobacco Use  . Smoking status: Never Smoker  . Smokeless tobacco: Never Used  Substance Use Topics  . Alcohol use: No  . Drug use: Yes    Types: Marijuana    Comment: occ      Allergies   Patient has no known allergies.   Review of Systems Review of Systems  Constitutional: Negative.   HENT: Negative.   Respiratory: Negative.   Cardiovascular: Negative.   Gastrointestinal: Negative.   Genitourinary: Positive for dysuria. Negative for decreased urine volume, difficulty urinating, discharge, flank pain, frequency, genital sores, hematuria, penile pain, penile swelling, scrotal swelling, testicular pain and urgency.  Musculoskeletal: Negative.   Skin: Negative.   Neurological: Negative.   All other systems reviewed and are negative.  Physical Exam Updated Vital Signs BP (!) 147/97   Pulse 100   Temp 98.4 F (36.9 C) (Oral)   Resp 16   SpO2 97%   Physical Exam Vitals signs and nursing note reviewed. Exam conducted with a chaperone present.  Constitutional:      General: He is not in acute distress.    Appearance: He is well-developed. He is not ill-appearing, toxic-appearing or diaphoretic.  HENT:     Head: Normocephalic and atraumatic.     Nose: Nose normal.     Mouth/Throat:     Mouth: Mucous membranes are  moist.     Pharynx: Oropharynx is clear.  Eyes:     Pupils: Pupils are equal, round, and reactive to light.  Neck:     Musculoskeletal: Normal range of motion and neck supple.  Cardiovascular:     Rate and Rhythm: Normal rate and regular rhythm.     Pulses: Normal pulses.     Heart sounds: Normal heart sounds.  Pulmonary:     Effort: Pulmonary effort is normal. No respiratory distress.     Breath sounds: Normal breath sounds.  Abdominal:     General: Bowel sounds are normal. There is no distension.     Palpations: Abdomen is soft.     Comments: Soft, nontender without rebound or guarding.  No inguinal lymphadenopathy  Genitourinary:    Penis: Normal.      Scrotum/Testes: Normal. Cremasteric reflex is present.     Epididymis:     Right: Normal.     Left: Normal.     Comments: Male tech present in room during exam.  No rashes, lesions.  Testes without tenderness, redness or warmth.  Cremasteric reflex present.  No tenderness over epididymis.  No inguinal lymphadenopathy.  No penile discharge.  Normal urethral meatus Musculoskeletal: Normal range of motion.  Skin:    General: Skin is warm and dry.     Comments: No edema, erythema or warmth.  Neurological:     Mental Status: He is alert.    ED Treatments / Results  Labs (all labs ordered are listed, but only abnormal results are displayed) Labs Reviewed  URINALYSIS, ROUTINE W REFLEX MICROSCOPIC - Abnormal; Notable for the following components:      Result Value   Color, Urine AMBER (*)    APPearance CLOUDY (*)    Specific Gravity, Urine 1.031 (*)    Hgb urine dipstick SMALL (*)    Ketones, ur 20 (*)    Protein, ur 100 (*)    Leukocytes,Ua MODERATE (*)    WBC, UA >50 (*)    Bacteria, UA RARE (*)    All other components within normal limits  RPR  HIV ANTIBODY (ROUTINE TESTING W REFLEX)  GC/CHLAMYDIA PROBE AMP ([place]    EKG None  Radiology No results found.  Procedures Procedures (including  critical care time)  Medications Ordered in ED Medications  cefTRIAXone (ROCEPHIN) injection 250 mg (250 mg Intramuscular Given 05/07/19 1550)  azithromycin (ZITHROMAX) tablet 1,000 mg (1,000 mg Oral Given 05/07/19 1549)  sterile water (preservative free) injection (10 mLs  Given 05/07/19 1551)   Initial Impression / Assessment and Plan / ED Course  I have reviewed the triage vital signs and the nursing notes.  Pertinent labs & imaging results that were available during my care of the patient were reviewed by me and considered in my medical decision making (see chart for details).  33 year old male appears otherwise well presents for  evaluation of exposure to STD.  Has had some mild dysuria however no penile discharge.  No pain with bowel movements.  Abdomen soft, nontender.  No acute abnormality on GU exam.  Does not appear septic or ill.  Tolerating p.o. intake at home without difficulty.  Patient is afebrile without abdominal tenderness, abdominal pain or painful bowel movements to indicate prostatitis.  No tenderness to palpation of the testes or epididymis to suggest orchitis or epididymitis.  STD cultures obtained includingHIV, syphilis, gonorrhea and chlamydia. Patient to be discharged with instructions to follow up with PCP. Discussed importance of using protection when sexually active. Pt understands that they have GC/Chlamydia cultures pending and that they will need to inform all sexual partners if results return positive. Patient has been treated prophylactically with azithromycin and Rocephin.   The patient has been appropriately medically screened and/or stabilized in the ED. I have low suspicion for any other emergent medical condition which would require further screening, evaluation or treatment in the ED or require inpatient management.       Final Clinical Impressions(s) / ED Diagnoses   Final diagnoses:  Exposure to STD    ED Discharge Orders    None        Dayvon Dax A, PA-C 05/07/19 1628    Long, Arlyss Repress, MD 05/08/19 [phone]

## 2019-05-07 NOTE — ED Notes (Signed)
Patient verbalizes understanding of discharge instructions. Opportunity for questioning and answers were provided. Armband removed by staff, pt discharged from ED.  

## 2019-05-08 LAB — RPR: RPR Ser Ql: NONREACTIVE

## 2019-05-11 LAB — GC/CHLAMYDIA PROBE AMP (~~LOC~~) NOT AT ARMC
Chlamydia: NEGATIVE
Neisseria Gonorrhea: POSITIVE — AB

## 2019-12-24 ENCOUNTER — Other Ambulatory Visit: Payer: Self-pay

## 2019-12-24 ENCOUNTER — Encounter (HOSPITAL_COMMUNITY): Payer: Self-pay | Admitting: Emergency Medicine

## 2019-12-24 ENCOUNTER — Emergency Department (HOSPITAL_COMMUNITY)
Admission: EM | Admit: 2019-12-24 | Discharge: 2019-12-24 | Disposition: A | Discharge status: Home / Self Care | Payer: Self-pay | Attending: Emergency Medicine | Admitting: Emergency Medicine

## 2019-12-24 DIAGNOSIS — Y9389 Activity, other specified: Secondary | ICD-10-CM | POA: Insufficient documentation

## 2019-12-24 DIAGNOSIS — Y999 Unspecified external cause status: Secondary | ICD-10-CM | POA: Insufficient documentation

## 2019-12-24 DIAGNOSIS — S30812A Abrasion of penis, initial encounter: Secondary | ICD-10-CM | POA: Insufficient documentation

## 2019-12-24 DIAGNOSIS — R590 Localized enlarged lymph nodes: Secondary | ICD-10-CM | POA: Insufficient documentation

## 2019-12-24 DIAGNOSIS — R369 Urethral discharge, unspecified: Secondary | ICD-10-CM | POA: Insufficient documentation

## 2019-12-24 DIAGNOSIS — Y929 Unspecified place or not applicable: Secondary | ICD-10-CM | POA: Insufficient documentation

## 2019-12-24 DIAGNOSIS — I10 Essential (primary) hypertension: Secondary | ICD-10-CM | POA: Insufficient documentation

## 2019-12-24 DIAGNOSIS — W504XXA Accidental scratch by another person, initial encounter: Secondary | ICD-10-CM | POA: Insufficient documentation

## 2019-12-24 LAB — URINALYSIS, ROUTINE W REFLEX MICROSCOPIC
Bilirubin Urine: NEGATIVE
Glucose, UA: NEGATIVE mg/dL
Hgb urine dipstick: NEGATIVE
Ketones, ur: 20 mg/dL — AB
Nitrite: NEGATIVE
Protein, ur: 30 mg/dL — AB
Specific Gravity, Urine: 1.029 (ref 1.005–1.030)
pH: 5 (ref 5.0–8.0)

## 2019-12-24 MED ORDER — LIDOCAINE HCL (PF) 1 % IJ SOLN: 1.0000 mL | Freq: Once | INTRAMUSCULAR | Status: DC

## 2019-12-24 MED ORDER — STERILE WATER FOR INJECTION IJ SOLN
INTRAMUSCULAR | Status: AC
  Administered 2019-12-24: 10 mL
  Filled 2019-12-24: qty 10

## 2019-12-24 MED ORDER — AZITHROMYCIN 250 MG PO TABS
1000.0000 mg | ORAL_TABLET | Freq: Once | ORAL | Status: AC
  Administered 2019-12-24: 1000 mg via ORAL
  Filled 2019-12-24: qty 4

## 2019-12-24 MED ORDER — CEFTRIAXONE SODIUM 500 MG IJ SOLR
500.0000 mg | Freq: Once | INTRAMUSCULAR | Status: AC
  Administered 2019-12-24: 500 mg via INTRAMUSCULAR
  Filled 2019-12-24: qty 500
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
## 2019-12-24 NOTE — Discharge Instructions (Addendum)
You were seen in the emergency department for an abrasion on your penis and also having pain with urination and penile discharge.  You also noticed some swollen knots in your groin.  You should use the bacitracin ointment for the abrasion on your penis.  We treated you for possible gonorrhea and chlamydia.  If your tests are positive we will contact you.  If you are positive you should contact your partner and have them get tested and treated.  Return to the emergency department if any worsening or concerning symptoms

## 2019-12-25 LAB — GC/CHLAMYDIA PROBE AMP (~~LOC~~) NOT AT ARMC
Chlamydia: NEGATIVE
Comment: NEGATIVE
Comment: NORMAL
Neisseria Gonorrhea: POSITIVE — AB

## 2021-05-20 ENCOUNTER — Emergency Department (HOSPITAL_COMMUNITY): Payer: No Typology Code available for payment source

## 2021-05-20 ENCOUNTER — Emergency Department (HOSPITAL_COMMUNITY)
Admission: EM | Admit: 2021-05-20 | Discharge: 2021-05-20 | Disposition: A | Discharge status: Home / Self Care | Payer: No Typology Code available for payment source | Attending: Emergency Medicine | Admitting: Emergency Medicine

## 2021-05-20 ENCOUNTER — Other Ambulatory Visit: Payer: Self-pay

## 2021-05-20 DIAGNOSIS — M791 Myalgia, unspecified site: Secondary | ICD-10-CM | POA: Diagnosis not present

## 2021-05-20 DIAGNOSIS — S0993XD Unspecified injury of face, subsequent encounter: Secondary | ICD-10-CM | POA: Diagnosis present

## 2021-05-20 DIAGNOSIS — S01411D Laceration without foreign body of right cheek and temporomandibular area, subsequent encounter: Secondary | ICD-10-CM | POA: Insufficient documentation

## 2021-05-20 DIAGNOSIS — I10 Essential (primary) hypertension: Secondary | ICD-10-CM | POA: Diagnosis not present

## 2021-05-20 MED ORDER — BACITRACIN ZINC 500 UNIT/GM EX OINT
TOPICAL_OINTMENT | Freq: Two times a day (BID) | CUTANEOUS | Status: DC
  Administered 2021-05-20: 1 via TOPICAL
  Filled 2021-05-20: qty 1.8

## 2021-05-20 MED ORDER — OXYCODONE-ACETAMINOPHEN 5-325 MG PO TABS: 1.0000 | ORAL_TABLET | Freq: Four times a day (QID) | ORAL | 0 refills | Status: AC | PRN

## 2021-05-20 MED ORDER — OXYCODONE-ACETAMINOPHEN 5-325 MG PO TABS
1.0000 | ORAL_TABLET | Freq: Once | ORAL | Status: AC
  Administered 2021-05-20: 1 via ORAL
  Filled 2021-05-20: qty 1

## 2021-05-20 MED ORDER — METHOCARBAMOL 500 MG PO TABS
500.0000 mg | ORAL_TABLET | Freq: Two times a day (BID) | ORAL | 0 refills | Status: DC
Start: 2021-05-20 — End: 2021-05-27
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
## 2021-05-27 ENCOUNTER — Other Ambulatory Visit: Payer: Self-pay

## 2021-05-27 ENCOUNTER — Emergency Department (HOSPITAL_COMMUNITY)
Admission: EM | Admit: 2021-05-27 | Discharge: 2021-05-27 | Disposition: A | Discharge status: Home / Self Care | Payer: Self-pay | Attending: Emergency Medicine | Admitting: Emergency Medicine

## 2021-05-27 DIAGNOSIS — Z4802 Encounter for removal of sutures: Secondary | ICD-10-CM

## 2021-05-27 DIAGNOSIS — Z79899 Other long term (current) drug therapy: Secondary | ICD-10-CM | POA: Insufficient documentation

## 2021-05-27 DIAGNOSIS — F1210 Cannabis abuse, uncomplicated: Secondary | ICD-10-CM | POA: Insufficient documentation

## 2021-05-27 DIAGNOSIS — I10 Essential (primary) hypertension: Secondary | ICD-10-CM | POA: Insufficient documentation

## 2021-05-27 DIAGNOSIS — M545 Low back pain, unspecified: Secondary | ICD-10-CM | POA: Insufficient documentation

## 2021-05-27 MED ORDER — METHOCARBAMOL 500 MG PO TABS
500.0000 mg | ORAL_TABLET | Freq: Two times a day (BID) | ORAL | 0 refills | Status: AC
Start: 2021-05-27 — End: ?

## 2021-05-27 MED ORDER — NAPROXEN 500 MG PO TABS: 500.0000 mg | ORAL_TABLET | Freq: Two times a day (BID) | ORAL | 0 refills | Status: AC | PRN
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
## 2021-06-14 ENCOUNTER — Ambulatory Visit (INDEPENDENT_AMBULATORY_CARE_PROVIDER_SITE_OTHER): Payer: Self-pay | Admitting: Sports Medicine

## 2021-06-14 ENCOUNTER — Ambulatory Visit
Admission: RE | Admit: 2021-06-14 | Discharge: 2021-06-14 | Disposition: A | Discharge status: Home / Self Care | Payer: Self-pay | Source: Ambulatory Visit | Attending: Sports Medicine | Admitting: Sports Medicine

## 2021-06-14 ENCOUNTER — Other Ambulatory Visit: Payer: Self-pay | Admitting: Sports Medicine

## 2021-06-14 VITALS — BP 135/99 | Ht 69.0 in | Wt 165.0 lb

## 2021-06-14 DIAGNOSIS — M546 Pain in thoracic spine: Secondary | ICD-10-CM

## 2021-06-14 DIAGNOSIS — R0781 Pleurodynia: Secondary | ICD-10-CM

## 2021-06-14 MED ORDER — MELOXICAM 15 MG PO TABS: 15.0000 mg | ORAL_TABLET | Freq: Every day | ORAL | 0 refills | Status: AC
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
## 2021-06-14 NOTE — Patient Instructions (Signed)
It was great to see meet today, thank you for letting me participate in your care.  Today, we discussed your back and rib pain. We are going to get x-rays of your back and ribs to ensure we do not see anything broken or any abnormalities.  I will give you a call once these results return.  Things for you to do: -Begin meloxicam 15 mg, take 1 tablet daily with breakfast.  You may take Tylenol with this but do not take any Aleve or ibuprofen/Motrin. -It is important to do the exercises for the low back and ribs (latissimus dorsi muscles) once a day for the next 3-4 weeks -You may use ice/heat, as well as icy hot or Tiger balm over the painful area of the ribs  You will follow-up with me in about a month if your pain is not getting better.  Depending on the results of your x-ray.  If you have any further questions, please give the clinic a call [phone].  Cheers,  Dr. [facility]

## 2021-07-12 ENCOUNTER — Ambulatory Visit (INDEPENDENT_AMBULATORY_CARE_PROVIDER_SITE_OTHER): Payer: Self-pay | Admitting: Sports Medicine

## 2021-07-12 VITALS — BP 132/90 | Ht 68.0 in | Wt 165.0 lb

## 2021-07-12 DIAGNOSIS — M546 Pain in thoracic spine: Secondary | ICD-10-CM

## 2021-07-12 DIAGNOSIS — R0781 Pleurodynia: Secondary | ICD-10-CM

## 2021-07-12 NOTE — Patient Instructions (Addendum)
Austin Alvarez,  It was good seeing you again today.  I am sorry that your back is still giving you issues.  At this point, I think it is best to either see a chiropractor or a physician who does manipulation.  Here are a few in the area:  *[facility] in [place], [place] Address: [address], [place], [place] [address] Phone: [phone]  [facility], [place] in [place], [place] Address: [address], [place], [place] [address] Phone: [phone]
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
## 2021-08-09 ENCOUNTER — Ambulatory Visit: Payer: Self-pay | Admitting: Sports Medicine

## 2022-06-04 ENCOUNTER — Encounter (HOSPITAL_COMMUNITY): Payer: Self-pay

## 2022-06-04 ENCOUNTER — Emergency Department (HOSPITAL_COMMUNITY): Payer: Self-pay

## 2022-06-04 ENCOUNTER — Emergency Department (HOSPITAL_COMMUNITY)
Admission: EM | Admit: 2022-06-04 | Discharge: 2022-06-04 | Discharge status: Against Medical Advice | Payer: Self-pay | Attending: Emergency Medicine | Admitting: Emergency Medicine

## 2022-06-04 ENCOUNTER — Other Ambulatory Visit: Payer: Self-pay

## 2022-06-04 DIAGNOSIS — Z5321 Procedure and treatment not carried out due to patient leaving prior to being seen by health care provider: Secondary | ICD-10-CM | POA: Insufficient documentation

## 2022-06-04 DIAGNOSIS — M79672 Pain in left foot: Secondary | ICD-10-CM | POA: Insufficient documentation

## 2022-06-04 NOTE — ED Provider Triage Note (Signed)
Emergency Medicine Provider Triage Evaluation Note  Austin Alvarez , a 35 y.o. male  was evaluated in triage.  Pt complains of left foot pain.  He says he started noticing pain on the bottom of his foot last week.  He thought it was a callus.  Not sure if he stepped on anything..  Review of Systems  Positive:  Negative:   Physical Exam  BP 121/80 (BP Location: Left Arm)   Pulse 80   Temp 98 F (36.7 C) (Oral)   Resp 16   SpO2 100%  Gen:   Awake, no distress   Resp:  Normal effort  MSK:   Moves extremities without difficulty  Other:  There is what looks like to be a healed wound on the bottom of the left foot.  I cannot visualize any foreign body or palpate anything.  Appears to be healing.  Medical Decision Making  Medically screening exam initiated at 2:23 PM.  Appropriate orders placed.  ATSUSHI YOM was informed that the remainder of the evaluation will be completed by another provider, this initial triage assessment does not replace that evaluation, and the importance of remaining in the ED until their evaluation is complete.     Claudie Leach, PA-C 06/04/22 1424

## 2022-06-04 NOTE — ED Triage Notes (Signed)
Patient said he has a callous on his left foot, towards the bottom. Patient said he stands on his feet for 12 hour shifts.

## 2022-06-05 ENCOUNTER — Encounter (HOSPITAL_COMMUNITY): Payer: Self-pay

## 2022-06-05 ENCOUNTER — Emergency Department (HOSPITAL_COMMUNITY)
Admission: EM | Admit: 2022-06-05 | Discharge: 2022-06-05 | Disposition: A | Discharge status: Home / Self Care | Payer: Self-pay | Attending: Emergency Medicine | Admitting: Emergency Medicine

## 2022-06-05 ENCOUNTER — Other Ambulatory Visit: Payer: Self-pay

## 2022-06-05 DIAGNOSIS — B070 Plantar wart: Secondary | ICD-10-CM | POA: Insufficient documentation

## 2022-06-05 NOTE — ED Provider Notes (Signed)
[facility]-EMERGENCY DEPT Provider Note   CSN: [id number] Arrival date & time: 06/05/22  0413     History  Chief Complaint  Patient presents with   Foot Pain    Austin Alvarez is a 36 y.o. male.  The history is provided by the patient.  Foot Pain  Austin Alvarez is a 36 y.o. male who presents to the Emergency Department complaining of foot pain.  He is emergency department for evaluation of a lesion and pain on his left foot that started on Thursday.  No associated fevers, rash, nausea or vomiting.  He has no known medical problems and takes no routine medications.  No reports of any injuries or foreign body.     Home Medications Prior to Admission medications   Medication Sig Start Date End Date Taking? Authorizing Provider  cephALEXin (KEFLEX) 500 MG capsule Take 1 capsule (500 mg total) 4 (four) times daily by mouth. 05/06/17   Muthersbaugh, Dahlia Client, PA-C  diazepam (VALIUM) 5 MG tablet Take 1 tablet (5 mg total) by mouth every 8 (eight) hours as needed for anxiety or muscle spasms. 10/07/14   Kirichenko, Lemont Fillers, PA-C  hydrOXYzine (ATARAX/VISTARIL) 25 MG tablet Take 1 tablet (25 mg total) by mouth every 6 (six) hours. 08/01/13   Arthor Captain, PA-C  ibuprofen (ADVIL,MOTRIN) 800 MG tablet Take 1 tablet (800 mg total) by mouth every 8 (eight) hours as needed for mild pain. 02/18/15   Ward, Layla Maw, DO  meloxicam (MOBIC) 15 MG tablet Take 1 tablet (15 mg total) by mouth daily. 06/14/21   Madelyn Brunner, DO  methocarbamol (ROBAXIN) 500 MG tablet Take 1 tablet (500 mg total) by mouth 2 (two) times daily. 05/27/21   Dartha Lodge, PA-C  naproxen (NAPROSYN) 500 MG tablet Take 1 tablet (500 mg total) by mouth 2 (two) times daily as needed for mild pain or moderate pain. 05/27/21   Dartha Lodge, PA-C  oxyCODONE-acetaminophen (PERCOCET/ROXICET) 5-325 MG tablet Take 1 tablet by mouth every 6 (six) hours as needed for severe pain. 05/20/21   Sponseller, Lupe Carney R,  PA-C  penicillin v potassium (VEETID) 500 MG tablet Take 1 tablet (500 mg total) by mouth 3 (three) times daily. 09/08/18   Margarita Grizzle, MD  permethrin (ELIMITE) 5 % cream Apply 1 application topically once. 08/01/13   Arthor Captain, PA-C      Allergies    Patient has no known allergies.    Review of Systems   Review of Systems  All other systems reviewed and are negative.   Physical Exam Updated Vital Signs BP (!) 125/102   Pulse 69   Temp 98.5 F (36.9 C) (Oral)   Ht 5\' 8"  (1.727 m)   Wt 74.8 kg   SpO2 100%   BMI 25.09 kg/m  Physical Exam Vitals and nursing note reviewed.  Constitutional:      Appearance: He is well-developed.  HENT:     Head: Normocephalic and atraumatic.  Cardiovascular:     Rate and Rhythm: Normal rate and regular rhythm.  Pulmonary:     Effort: Pulmonary effort is normal. No respiratory distress.  Musculoskeletal:     Comments: There is a 1 cm area of firmness on the lateral plantar surface of the forefoot without surrounding erythema or edema.  There is a 1 to 2 mm central area with cauliflower head consistent with wart.  2+ DP pulse.  Skin:    General: Skin is warm and dry.  Neurological:  Mental Status: He is alert and oriented to person, place, and time.  Psychiatric:        Behavior: Behavior normal.     ED Results / Procedures / Treatments   Labs (all labs ordered are listed, but only abnormal results are displayed) Labs Reviewed - No data to display  EKG None  Radiology DG Foot Complete Left  Result Date: 06/04/2022 CLINICAL DATA:  Callus sensation of the plantar aspect of the 5th MTP joint. Question foreign body. EXAM: LEFT FOOT - COMPLETE 3+ VIEW COMPARISON:  None Available. FINDINGS: The mineralization and alignment are normal. There is no evidence of acute fracture or dislocation. Minimal joint space narrowing at the 1st metatarsophalangeal joint. The additional joint spaces are preserved. The tibial sesamoid of the 1st  metatarsal appears diminutive. No soft tissue abnormalities are identified. No evidence of radiopaque foreign body. IMPRESSION: No evidence of radiopaque foreign body or acute osseous abnormality. Minimal degenerative changes at the 1st MTP joint. Electronically Signed   By: Richardean Sale M.D.   On: 06/04/2022 14:55    Procedures Procedures    Medications Ordered in ED Medications - No data to display  ED Course/ Medical Decision Making/ A&P                           Medical Decision Making  Patient here for evaluation of left foot pain since Thursday.  Examination is consistent with a plantar wart.  Images from earlier ED visit reviewed-no evidence of foreign body.  Examination is not consistent with acute bacterial infection, acute fracture, gout.  Discussed with patient home care for plantar wart.       Final Clinical Impression(s) / ED Diagnoses Final diagnoses:  Plantar wart of left foot    Rx / DC Orders ED Discharge Orders     None         Quintella Reichert, MD 06/05/22 [phone]

## 2022-06-05 NOTE — ED Triage Notes (Signed)
Pt reports having a callus on his left foot. Pt was here earlier but left because it was too busy.
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
